# Patient Record
Sex: Female | Born: 1960 | Race: White | Hispanic: No | Smoking: Never smoker
Health system: Southern US, Community
[De-identification: ages and names within clinical notes are randomized; demographics above are authoritative.]

## PROBLEM LIST (undated history)

## (undated) DIAGNOSIS — D0510 Intraductal carcinoma in situ of unspecified breast: Secondary | ICD-10-CM

## (undated) DIAGNOSIS — Z803 Family history of malignant neoplasm of breast: Secondary | ICD-10-CM

## (undated) DIAGNOSIS — J189 Pneumonia, unspecified organism: Secondary | ICD-10-CM

## (undated) HISTORY — PX: BREAST BIOPSY: SHX20

## (undated) HISTORY — DX: Pneumonia, unspecified organism: J18.9

## (undated) HISTORY — DX: Intraductal carcinoma in situ of unspecified breast: D05.10

## (undated) HISTORY — DX: Family history of malignant neoplasm of breast: Z80.3

---

## 1995-03-15 HISTORY — PX: TUBAL LIGATION: SHX77

## 2013-09-11 DIAGNOSIS — I341 Nonrheumatic mitral (valve) prolapse: Secondary | ICD-10-CM | POA: Insufficient documentation

## 2014-07-18 DIAGNOSIS — R000 Tachycardia, unspecified: Secondary | ICD-10-CM | POA: Insufficient documentation

## 2014-12-12 ENCOUNTER — Other Ambulatory Visit: Payer: Self-pay | Admitting: Otolaryngology

## 2014-12-12 DIAGNOSIS — R220 Localized swelling, mass and lump, head: Secondary | ICD-10-CM

## 2014-12-12 DIAGNOSIS — R221 Localized swelling, mass and lump, neck: Principal | ICD-10-CM

## 2014-12-17 ENCOUNTER — Ambulatory Visit: Admission: RE | Admit: 2014-12-17 | Payer: Self-pay | Source: Ambulatory Visit

## 2014-12-19 ENCOUNTER — Ambulatory Visit
Admission: RE | Admit: 2014-12-19 | Discharge: 2014-12-19 | Disposition: A | Discharge status: Home / Self Care | Payer: BLUE CROSS/BLUE SHIELD | Source: Ambulatory Visit | Attending: Otolaryngology | Admitting: Otolaryngology

## 2014-12-19 DIAGNOSIS — R221 Localized swelling, mass and lump, neck: Secondary | ICD-10-CM | POA: Diagnosis not present

## 2014-12-19 DIAGNOSIS — R220 Localized swelling, mass and lump, head: Secondary | ICD-10-CM

## 2014-12-19 MED ORDER — IOHEXOL 350 MG/ML SOLN
75.0000 mL | Freq: Once | INTRAVENOUS | Status: AC | PRN
  Administered 2014-12-19: 75 mL via INTRAVENOUS

## 2014-12-25 DIAGNOSIS — Z8673 Personal history of transient ischemic attack (TIA), and cerebral infarction without residual deficits: Secondary | ICD-10-CM | POA: Insufficient documentation

## 2015-01-07 DIAGNOSIS — E782 Mixed hyperlipidemia: Secondary | ICD-10-CM | POA: Insufficient documentation

## 2016-10-06 IMAGING — CT CT NECK W/ CM
4 of 5 series · 16 of 33 positions shown, 18 images · IV contrast (omnipaque)
Comparison: None.

CLINICAL DATA: Swelling, mass, or lump in the neck. Intermittent
pressure in the right side of the neck with left-sided numbness.

EXAM:
CT NECK WITH CONTRAST
TECHNIQUE: Multidetector CT imaging of the neck was performed using the
standard protocol following the bolus administration of intravenous
contrast.
CONTRAST:  75mL OMNIPAQUE IOHEXOL 350 MG/ML SOLN

[Series 4: axial neck · axial · 0.52mm/px · z∈[-523,-385]mm · 4 of 116 slices shown, 5 images]
[im 24/116  soft-tissue]
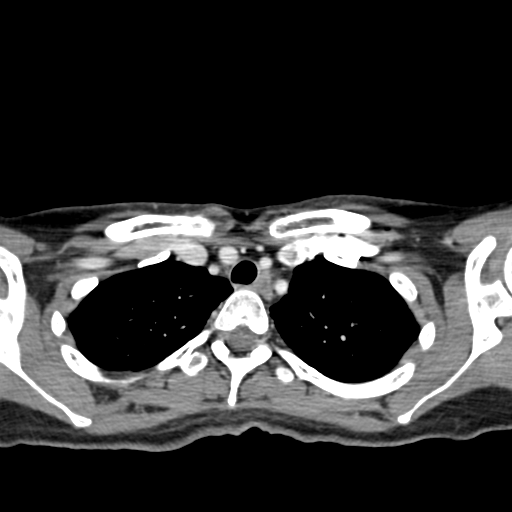
[im 24/116  bone]
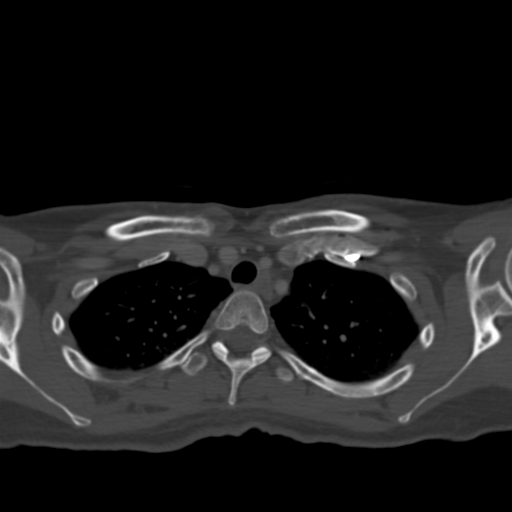
[im 47/116  bone]
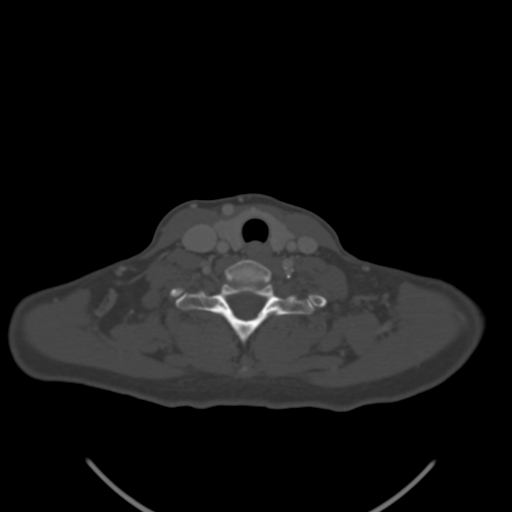
[im 70/116  bone]
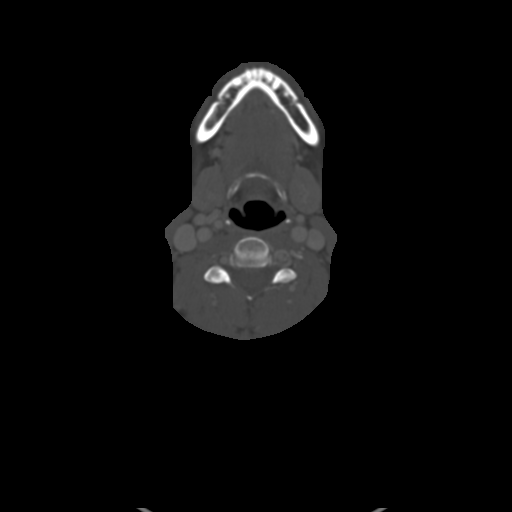
[im 93/116  bone]
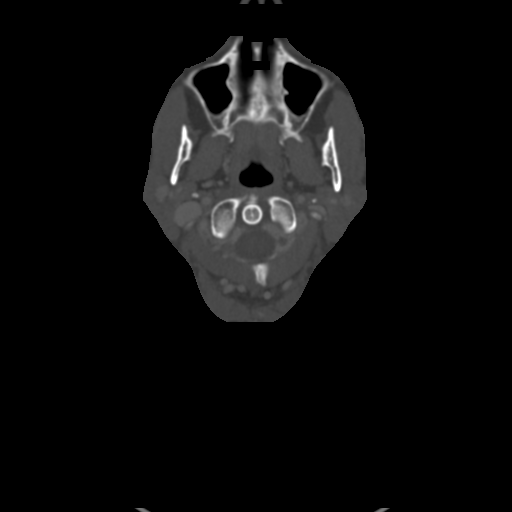

[Series 602: coronal · coronal · 0.52mm/px · 3 of 86 slices shown]
[im 18/86  bone]
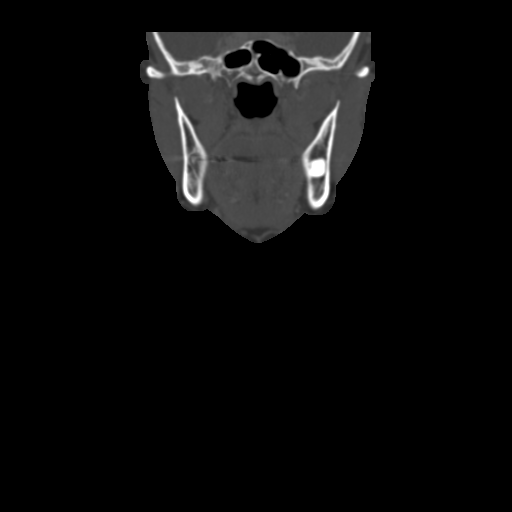
[im 35/86  bone]
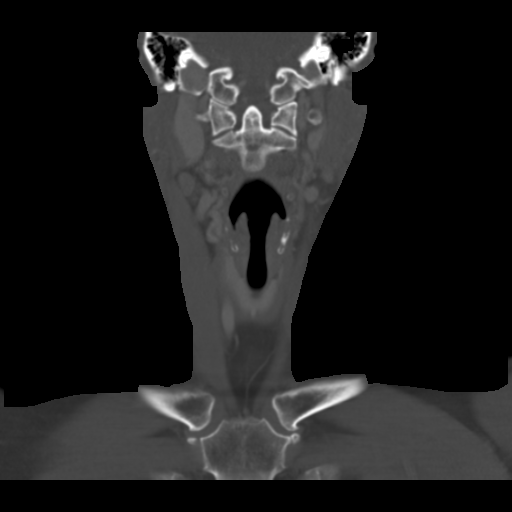
[im 52/86  bone]
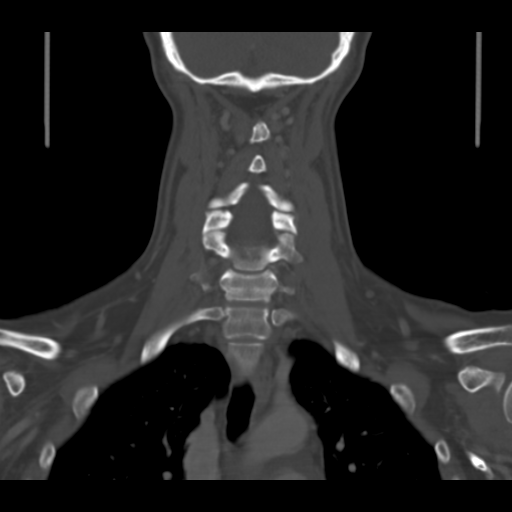

[Series 603: sagittal · sagittal · 0.52mm/px · 5 of 90 slices shown, 6 images]
[im 30/90  bone]
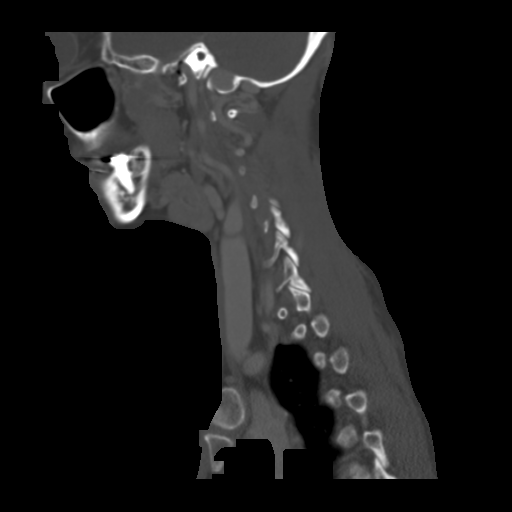
[im 38/90  bone]
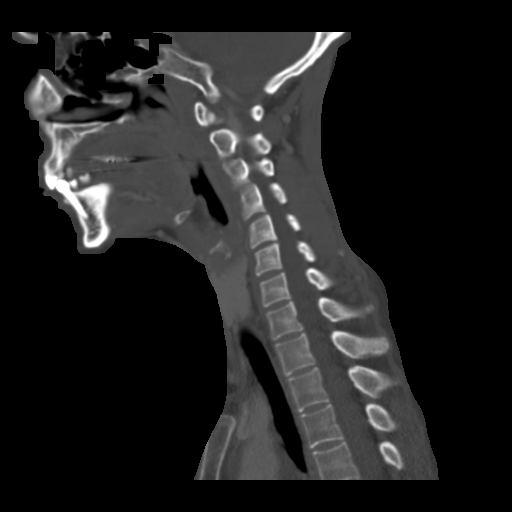
[im 45/90  soft-tissue]
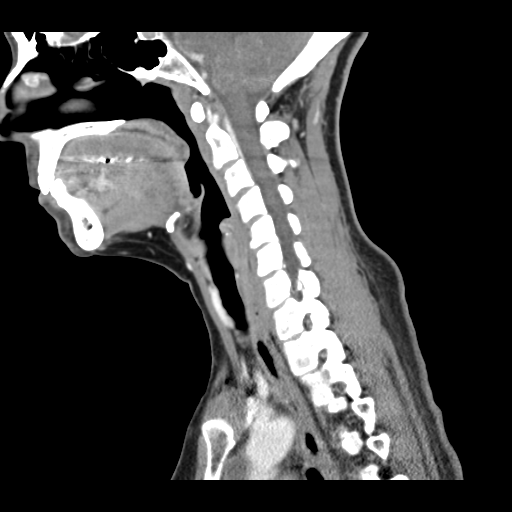
[im 45/90  bone]
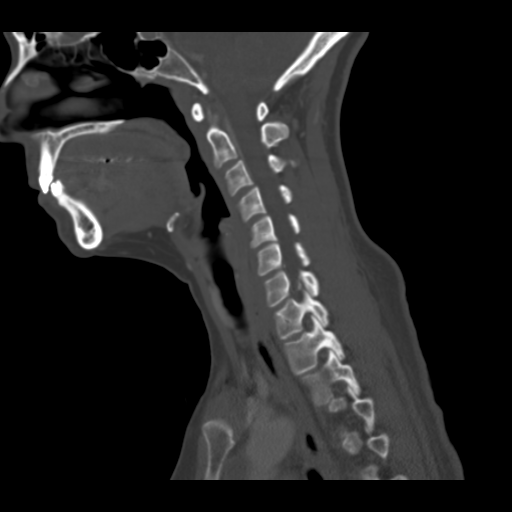
[im 52/90  bone]
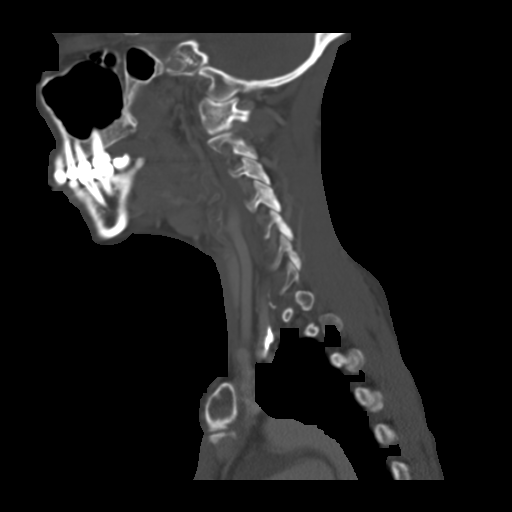
[im 60/90  bone]
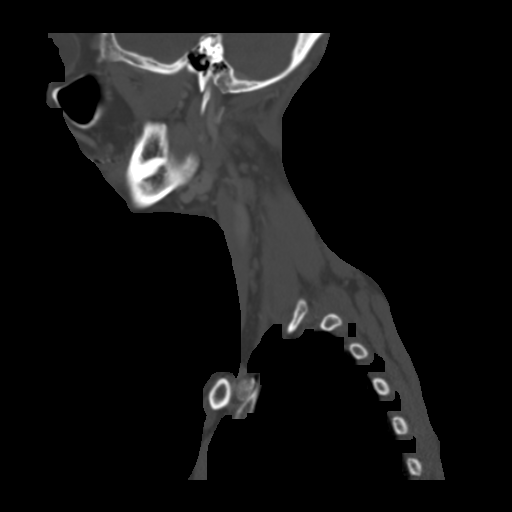

[Series 604: cranial caudal axial · axial · 0.52mm/px · z∈[-473,-337]mm · 4 of 120 slices shown]
[im 24/120  bone]
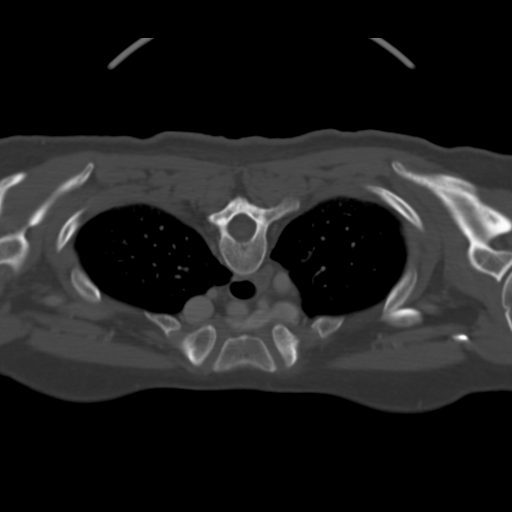
[im 48/120  bone]
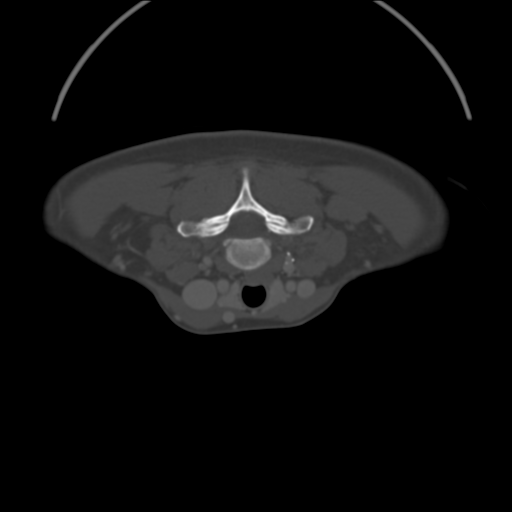
[im 72/120  bone]
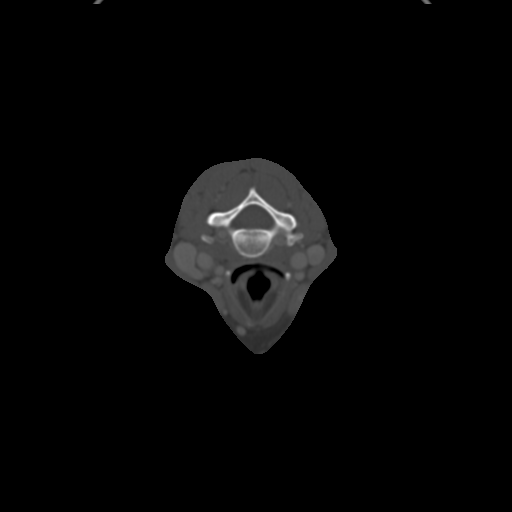
[im 96/120  bone]
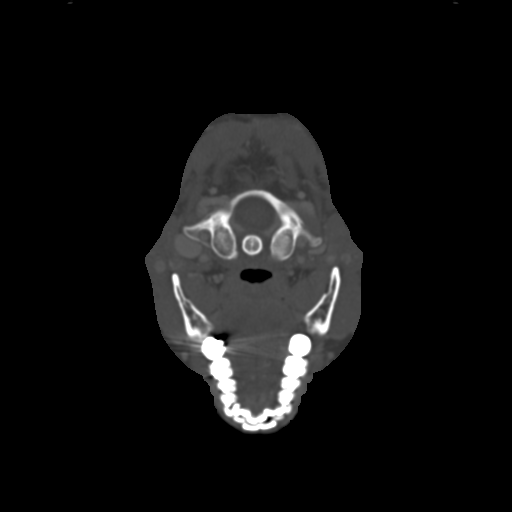

[16 of 33 positions shown; findings below may reference images not displayed]

FINDINGS: Pharynx and larynx: No focal mucosal or submucosal lesions are
present. The tongue base is within normal limits. Vocal cords are
midline and symmetric.

Salivary glands: Within normal limits

Thyroid: Negative

Lymph nodes: There is mild prominence of level 2 lymph nodes
bilaterally. The largest right-sided node measures 11 x 7 mm. The
largest left-sided nodes are posterior measuring 9 x 14 mm.

Vascular: No significant atherosclerotic calcifications or stenoses
are evident.

Limited intracranial: Within normal limits

Visualized orbits: Unremarkable

Mastoids and visualized paranasal sinuses: Cleared

Skeleton: Within normal limits

Upper chest: The lung apices are clear. There is some fluid post
anterior and inferior to the aortic arch which is likely within the
pericardial recess. Calcification in the left renal hilum likely
represents previous granulomatous disease.
IMPRESSION: 1. Fluid anterior and inferior to the aortic arch suggests a
pericardial effusion. The remainder the mediastinum is not imaged.
Echocardiography is recommended for further evaluation. CT the chest
with contrast could be used alternatively.
2. Reactive sized level 2 lymph nodes bilaterally.
These results were called by telephone at the time of interpretation
on 12/19/2014 at [DATE] to Dr. TWYLA MARS , who verbally
acknowledged these results.

## 2022-09-12 ENCOUNTER — Ambulatory Visit: Payer: Self-pay | Attending: Family

## 2022-09-12 ENCOUNTER — Ambulatory Visit (INDEPENDENT_AMBULATORY_CARE_PROVIDER_SITE_OTHER): Payer: Self-pay | Admitting: Family

## 2022-09-12 ENCOUNTER — Encounter: Payer: Self-pay | Admitting: Family

## 2022-09-12 VITALS — BP 128/80 | HR 77 | Temp 97.6°F | Ht 62.0 in | Wt 141.6 lb

## 2022-09-12 DIAGNOSIS — R002 Palpitations: Secondary | ICD-10-CM

## 2022-09-12 DIAGNOSIS — Z0000 Encounter for general adult medical examination without abnormal findings: Secondary | ICD-10-CM

## 2022-09-12 DIAGNOSIS — Z1159 Encounter for screening for other viral diseases: Secondary | ICD-10-CM

## 2022-09-12 NOTE — Assessment & Plan Note (Signed)
Reviewed past medical ,surgical and family history.

## 2022-09-12 NOTE — Assessment & Plan Note (Signed)
No chest pain or palpitations today.  Discussed episodic palpitation, shortness of breath.  EKG obtained with questionable Q wave lead aVF, nonspecific T wave inversion V1, aVF.  Advised patient to remain hypervigilant and if chest pain or palpitations were to recur to call 911 or present to the nearest emergency room.  I ordered echocardiogram, ZIO monitor and placed a referral to cardiology.  Pending baseline labs

## 2022-09-12 NOTE — Patient Instructions (Addendum)
Please call  Delford Field and discuss cost of  3D mammogram     [facility]  ( new location in 2023)  [address], [place], [place] [address]  [place], [place]  [phone]   Referral to cardiology, echocardiogram  Let us know if you dont hear back within a week in regards to an appointment being scheduled.   So that you are aware, if you are [facility] MyChart user , please pay attention to your MyChart messages as you may receive a MyChart message with a phone number to call and schedule this test/appointment own your own from our referral coordinator. This is a new process so I do not want you to miss this message.  If you are not a MyChart user, you will receive a phone call.    I have ordered a 14 day Zio monitor which will mailed directly to you. The device will include instructions on how to apply.   The Zio 14 day monitor that we use DOES NOT have 24 hour live monitoring. The rhythm of your heart will not be monitored while you are wearing it. Only AFTER you mail the device back in and a cardiologist interprets the data will we know if an underlying cardiac arrhythmia is going on.   There are models that offer 24 hour live monitoring however NOT this one. I wanted to be sure that you were aware of this as certainly didn't want this to be a false sense of security.   If you experience chest pain, shortness of breath, left arm numbness, or sustained, more frequent palpitations , do not wait and call 911 right away. We are in a delicate period of work up regarding palpitations and until we are sure that you do not have an underlying arrhythmia, you must be extremely vigilant and cautious.      ZIO XT- Long Term Monitor Instructions   Your provider has requested you wear a ZIO patch monitor for 14 days.  This is a single patch monitor. [employer] supplies one patch monitor per enrollment. Additional stickers are not available. Please do not apply patch if you will be  having a Nuclear Stress Test,  Echocardiogram, Cardiac CT, MRI, or Chest Xray during the period you would be wearing the  monitor. The patch cannot be worn during these tests. You cannot remove and re-apply the  ZIO XT patch monitor.  Your ZIO patch monitor will be mailed 3 day USPS to your address on file. It may take 3-5 days  to receive your monitor after you have been enrolled.  Once you have received your monitor, please review the enclosed instructions. Your monitor  has already been registered assigning a specific monitor serial # to you.   Billing and Patient Assistance Program Information   We have supplied [employer] with any of your insurance information on file for billing purposes. [employer] offers a sliding scale Patient Assistance Program for patients that do not have  insurance, or whose insurance does not completely cover the cost of the ZIO monitor.  You must apply for the Patient Assistance Program to qualify for this discounted rate.  To apply, please call [employer] at [phone], select option 4, select option 2, ask to apply for  Patient Assistance Program. Meredeth Ide will ask your household income, and how many people  are in your household. They will quote your out-of-pocket cost based on that information.  [employer] will also be able to set up a 83-month, interest-free payment  plan if needed.   Applying the monitor   Shave hair from upper left chest.  Hold abrader disc by orange tab. Rub abrader in 40 strokes over the upper left chest as  indicated in your monitor instructions.  Clean area with 4 enclosed alcohol pads. Let dry.  Apply patch as indicated in monitor instructions. Patch will be placed under collarbone on left  side of chest with arrow pointing upward.  Rub patch adhesive wings for 2 minutes. Remove white label marked "1". Remove the white  label marked "2". Rub patch adhesive wings for 2 additional minutes.  While looking in a mirror, press and release  button in center of patch. A small green light will  flash 3-4 times. This will be your only indicator that the monitor has been turned on.  Do not shower for the first 24 hours. You may shower after the first 24 hours.  Press the button if you feel a symptom. You will hear a small click. Record Date, Time and  Symptom in the Patient Logbook.  When you are ready to remove the patch, follow instructions on the last 2 pages of Patient  Logbook. Stick patch monitor onto the last page of Patient Logbook.  Place Patient Logbook in the blue and white box. Use locking tab on box and tape box closed  securely. The blue and white box has prepaid postage on it. Please place it in the mailbox as  soon as possible. Your physician should have your test results approximately 7 days after the  monitor has been mailed back to [facility].  Call [facility] Customer Care at [phone] if you have questions regarding  your ZIO XT patch monitor. Call them immediately if you see an orange light blinking on your  monitor.  If your monitor falls off in less than 4 days, contact our Monitor department at [phone].  If your monitor becomes loose or falls off after 4 days call [employer] at [phone] for  suggestions on securing your monitor       Palpitations Palpitations are feelings that your heartbeat is irregular or is faster than normal. It may feel like your heart is fluttering or skipping a beat. Palpitations may be caused by many things, including smoking, caffeine, alcohol, stress, and certain medicines or drugs. Most causes of palpitations are not serious.  However, some palpitations can be a sign of a serious problem. Further tests and a thorough medical history will be done to find the cause of your palpitations. Your provider may order tests such as an ECG, labs, an echocardiogram, or an ambulatory continuous ECG monitor. Follow these instructions at home: Pay attention to any changes  in your symptoms. Let your health care provider know about them. Take these actions to help manage your symptoms: Eating and drinking Follow instructions from your health care provider about eating or drinking restrictions. You may need to avoid foods and drinks that may cause palpitations. These may include: Caffeinated coffee, tea, soft drinks, and energy drinks. Chocolate. Alcohol. Diet pills. Lifestyle     Take steps to reduce your stress and anxiety. Things that can help you relax include: Yoga. Mind-body activities, such as deep breathing, meditation, or using words and images to create positive thoughts (guided imagery). Physical activity, such as swimming, jogging, or walking. Tell your health care provider if your palpitations increase with activity. If you have chest pain or shortness of breath with activity, do not continue the activity until you are seen by  your health care provider. Biofeedback. This is a method that helps you learn to use your mind to control things in your body, such as your heartbeat. Get plenty of rest and sleep. Keep a regular bed time. Do not use drugs, including cocaine or ecstasy. Do not use marijuana. Do not use any products that contain nicotine or tobacco. These products include cigarettes, chewing tobacco, and vaping devices, such as e-cigarettes. If you need help quitting, ask your health care provider. General instructions Take over-the-counter and prescription medicines only as told by your health care provider. Keep all follow-up visits. This is important. These may include visits for further testing if palpitations do not go away or get worse. Contact a health care provider if: You continue to have a fast or irregular heartbeat for a long period of time. You notice that your palpitations occur more often. Get help right away if: You have chest pain or shortness of breath. You have a severe headache. You feel dizzy or you faint. These  symptoms may represent a serious problem that is an emergency. Do not wait to see if the symptoms will go away. Get medical help right away. Call your local emergency services (911 in the [place] Do not drive yourself to the hospital. Summary Palpitations are feelings that your heartbeat is irregular or is faster than normal. It may feel like your heart is fluttering or skipping a beat. Palpitations may be caused by many things, including smoking, caffeine, alcohol, stress, certain medicines, and drugs. Further tests and a thorough medical history may be done to find the cause of your palpitations. Get help right away if you faint or have chest pain, shortness of breath, severe headache, or dizziness. This information is not intended to replace advice given to you by your health care provider. Make sure you discuss any questions you have with your health care provider. Document Revised: 07/22/2020 Document Reviewed: 07/22/2020 Elsevier Patient Education  2024 [employer].

## 2022-09-12 NOTE — Progress Notes (Unsigned)
Assessment & Plan:  Encounter for medical examination to establish care Assessment & Plan: Reviewed past medical ,surgical and family history.  Orders: -     CBC with Differential/Platelet; Future -     Comprehensive metabolic panel; Future -     Hemoglobin A1c; Future -     Lipid panel; Future -     Microalbumin / creatinine urine ratio; Future -     VITAMIN D 25 Hydroxy (Vit-D Deficiency, Fractures); Future -     Urinalysis, Routine w reflex microscopic; Future  Encounter for hepatitis C screening test for low risk patient -     Hepatitis C antibody; Future  Heart palpitations -     EKG 12-Lead  Palpitation Assessment & Plan: No chest pain or palpitations today.  Discussed episodic palpitation, shortness of breath.  EKG obtained with questionable Q wave lead aVF, nonspecific T wave inversion V1, aVF.  Advised patient to remain hypervigilant and if chest pain or palpitations were to recur to call 911 or present to the nearest emergency room.  I ordered echocardiogram, ZIO monitor and placed a referral to cardiology.  Pending baseline labs  Orders: -     ECHOCARDIOGRAM COMPLETE; Future -     LONG TERM MONITOR (3-14 DAYS); Future -     Ambulatory referral to Cardiology     Return precautions given.   Risks, benefits, and alternatives of the medications and treatment plan prescribed today were discussed, and patient expressed understanding.   Education regarding symptom management and diagnosis given to patient on AVS either electronically or printed.  Return in about 6 weeks (around 10/24/2022) for Fasting labs in 2-3 weeks.  Traci Plowman, FNP  Subjective:    Patient ID: Traci Knight, female    DOB: April 06, 1960, 62 y.o.   MRN: [id number]  CC: Traci Knight is a 62 y.o. female who presents today to establish care.    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 He passed last  year, and stress has improved.   Less episodes of palpitations.  There have been times where she felt pressure over her chest, and she took an 81 mg aspirin with relief. No CP today.   No chest pain with exercise.    She complains of 'high heart rate' such as when mows the grass, HR will approach 170 on her watch.  She had episodes of feeling short of breath when mowing the grass ; she rested for a few minutes and was able to resume mowing the grass.    She walks the dogs daily.   No associated crushing chest pain, dizziness, HA, vision changes  She hasn't seen PCP in a couple of years.   Echocardiogram 12/2013 Normal left systolic function , mild MVR  Stress test 12/2014 - she states incomplete and she didn't finish ( no results in epic under [facility])   Allergies: Sulfa antibiotics No current outpatient medications on file prior to visit.   No current facility-administered medications on file prior to visit.    Review of Systems  Constitutional:  Negative for chills and fever.  Respiratory:  Negative for cough.   Cardiovascular:  Positive for chest pain and palpitations.  Gastrointestinal:  Negative for nausea and vomiting.  Neurological:  Negative for dizziness and headaches.      Objective:    BP 128/80   Pulse 77   Temp 97.6 F (36.4 C) (Oral)  Ht 5\' 2"  (1.575 m)   Wt 141 lb 9.6 oz (64.2 kg)   LMP  (LMP Unknown)   SpO2 98%   BMI 25.90 kg/m  BP Readings from Last 3 Encounters:  09/12/22 128/80   Wt Readings from Last 3 Encounters:  09/12/22 141 lb 9.6 oz (64.2 kg)    Physical Exam Vitals reviewed.  Constitutional:      Appearance: She is well-developed.  Eyes:     Conjunctiva/sclera: Conjunctivae normal.  Neck:     Vascular: No carotid bruit.  Cardiovascular:     Rate and Rhythm: Normal rate and regular rhythm.     Pulses: Normal pulses.     Heart sounds: Normal heart sounds.  Pulmonary:     Effort: Pulmonary effort is normal.     Breath sounds:  Normal breath sounds. No wheezing, rhonchi or rales.  Skin:    General: Skin is warm and dry.  Neurological:     Mental Status: She is alert.  Psychiatric:        Speech: Speech normal.        Behavior: Behavior normal.        Thought Content: Thought content normal.

## 2022-09-14 DIAGNOSIS — R002 Palpitations: Secondary | ICD-10-CM

## 2022-09-26 ENCOUNTER — Other Ambulatory Visit (INDEPENDENT_AMBULATORY_CARE_PROVIDER_SITE_OTHER): Payer: Self-pay

## 2022-09-26 DIAGNOSIS — R7989 Other specified abnormal findings of blood chemistry: Secondary | ICD-10-CM

## 2022-09-26 DIAGNOSIS — Z0000 Encounter for general adult medical examination without abnormal findings: Secondary | ICD-10-CM

## 2022-09-26 DIAGNOSIS — Z1159 Encounter for screening for other viral diseases: Secondary | ICD-10-CM

## 2022-09-26 DIAGNOSIS — R002 Palpitations: Secondary | ICD-10-CM

## 2022-09-26 LAB — URINALYSIS, ROUTINE W REFLEX MICROSCOPIC
Bilirubin Urine: NEGATIVE
Hgb urine dipstick: NEGATIVE
Ketones, ur: NEGATIVE
Nitrite: NEGATIVE
Specific Gravity, Urine: 1.025 (ref 1.000–1.030)
Total Protein, Urine: NEGATIVE
Urine Glucose: NEGATIVE
Urobilinogen, UA: 0.2 (ref 0.0–1.0)
pH: 6 (ref 5.0–8.0)

## 2022-09-26 LAB — MICROALBUMIN / CREATININE URINE RATIO
Creatinine,U: 100.6 mg/dL
Microalb Creat Ratio: 0.7 mg/g (ref 0.0–30.0)
Microalb, Ur: <0.7 mg/dL (ref 0.0–1.9)

## 2022-09-26 LAB — VITAMIN D 25 HYDROXY (VIT D DEFICIENCY, FRACTURES): VITD: 32.18 ng/mL (ref 30.00–100.00)

## 2022-09-26 LAB — COMPREHENSIVE METABOLIC PANEL
ALT: 13 U/L (ref 0–35)
AST: 18 U/L (ref 0–37)
Albumin: 4.1 g/dL (ref 3.5–5.2)
Alkaline Phosphatase: 40 U/L (ref 39–117)
BUN: 19 mg/dL (ref 6–23)
CO2: 26 mEq/L (ref 19–32)
Calcium: 9.6 mg/dL (ref 8.4–10.5)
Chloride: 108 mEq/L (ref 96–112)
Creatinine, Ser: 0.93 mg/dL (ref 0.40–1.20)
GFR: 67.1 mL/min (ref >60.00)
Glucose, Bld: 96 mg/dL (ref 70–99)
Potassium: 4.6 mEq/L (ref 3.5–5.1)
Sodium: 140 mEq/L (ref 135–145)
Total Bilirubin: 0.5 mg/dL (ref 0.2–1.2)
Total Protein: 6.5 g/dL (ref 6.0–8.3)

## 2022-09-26 LAB — CBC WITH DIFFERENTIAL/PLATELET
Basophils Absolute: 0 10*3/uL (ref 0.0–0.1)
Basophils Relative: 0.6 % (ref 0.0–3.0)
Eosinophils Absolute: 0.3 10*3/uL (ref 0.0–0.7)
Eosinophils Relative: 6.6 % — ABNORMAL HIGH (ref 0.0–5.0)
HCT: 41.6 % (ref 36.0–46.0)
Hemoglobin: 13.4 g/dL (ref 12.0–15.0)
Lymphocytes Relative: 37.8 % (ref 12.0–46.0)
Lymphs Abs: 1.5 10*3/uL (ref 0.7–4.0)
MCHC: 32.1 g/dL (ref 30.0–36.0)
MCV: 87.8 fl (ref 78.0–100.0)
Monocytes Absolute: 0.3 10*3/uL (ref 0.1–1.0)
Monocytes Relative: 8.5 % (ref 3.0–12.0)
Neutro Abs: 1.8 10*3/uL (ref 1.4–7.7)
Neutrophils Relative %: 46.5 % (ref 43.0–77.0)
Platelets: 285 10*3/uL (ref 150.0–400.0)
RBC: 4.74 Mil/uL (ref 3.87–5.11)
RDW: 14.4 % (ref 11.5–15.5)
WBC: 4 10*3/uL (ref 4.0–10.5)

## 2022-09-26 LAB — LIPID PANEL
Cholesterol: 242 mg/dL — ABNORMAL HIGH (ref 0–200)
HDL: 55.1 mg/dL (ref >39.00)
LDL Cholesterol: 167 mg/dL — ABNORMAL HIGH (ref 0–99)
NonHDL: 187.39
Total CHOL/HDL Ratio: 4
Triglycerides: 101 mg/dL (ref 0.0–149.0)
VLDL: 20.2 mg/dL (ref 0.0–40.0)

## 2022-09-26 LAB — HEMOGLOBIN A1C: Hgb A1c MFr Bld: 5.6 % (ref 4.6–6.5)

## 2022-09-27 ENCOUNTER — Other Ambulatory Visit: Payer: Self-pay

## 2022-09-27 LAB — HEPATITIS C ANTIBODY: Hepatitis C Ab: NONREACTIVE

## 2022-10-03 ENCOUNTER — Encounter: Payer: Self-pay | Admitting: Family

## 2022-10-04 NOTE — Telephone Encounter (Signed)
Spoke to pt and offered her an appt but pt declined. Pt stated that she will go elsewhere.

## 2022-10-06 NOTE — Addendum Note (Signed)
Addended by: [place], Caid Radin on: 10/06/2022 01:33 PM   Modules accepted: Orders

## 2022-10-06 NOTE — Addendum Note (Signed)
Addended by: [place], Yarden Hillis on: 10/06/2022 01:43 PM   Modules accepted: Orders

## 2022-10-12 ENCOUNTER — Encounter (INDEPENDENT_AMBULATORY_CARE_PROVIDER_SITE_OTHER): Payer: Self-pay

## 2022-10-17 ENCOUNTER — Ambulatory Visit
Admission: RE | Admit: 2022-10-17 | Discharge: 2022-10-17 | Disposition: A | Discharge status: Home / Self Care | Payer: Self-pay | Source: Ambulatory Visit | Attending: Family | Admitting: Family

## 2022-10-17 DIAGNOSIS — I083 Combined rheumatic disorders of mitral, aortic and tricuspid valves: Secondary | ICD-10-CM | POA: Insufficient documentation

## 2022-10-17 DIAGNOSIS — R002 Palpitations: Secondary | ICD-10-CM

## 2022-10-17 DIAGNOSIS — R9431 Abnormal electrocardiogram [ECG] [EKG]: Secondary | ICD-10-CM | POA: Insufficient documentation

## 2022-10-17 LAB — ECHOCARDIOGRAM COMPLETE
AR max vel: 2.41 cm2
AV Area VTI: 2.29 cm2
AV Area mean vel: 2.22 cm2
AV Mean grad: 2 mmHg
AV Peak grad: 4.5 mmHg
Ao pk vel: 1.06 m/s
Area-P 1/2: 3.58 cm2
MV VTI: 2.66 cm2
S' Lateral: 3 cm

## 2022-10-17 NOTE — Progress Notes (Signed)
*  PRELIMINARY RESULTS* Echocardiogram 2D Echocardiogram has been performed.  Traci Knight 10/17/2022, 9:46 AM

## 2022-10-26 ENCOUNTER — Encounter: Payer: Self-pay | Admitting: Family

## 2022-10-26 ENCOUNTER — Ambulatory Visit (INDEPENDENT_AMBULATORY_CARE_PROVIDER_SITE_OTHER): Payer: Self-pay | Admitting: Family

## 2022-10-26 VITALS — BP 126/78 | HR 91 | Temp 97.9°F | Ht 62.0 in | Wt 143.0 lb

## 2022-10-26 DIAGNOSIS — N390 Urinary tract infection, site not specified: Secondary | ICD-10-CM

## 2022-10-26 DIAGNOSIS — R7989 Other specified abnormal findings of blood chemistry: Secondary | ICD-10-CM

## 2022-10-26 DIAGNOSIS — Z1231 Encounter for screening mammogram for malignant neoplasm of breast: Secondary | ICD-10-CM

## 2022-10-26 DIAGNOSIS — R002 Palpitations: Secondary | ICD-10-CM

## 2022-10-26 NOTE — Progress Notes (Signed)
Assessment & Plan:  Encounter for screening mammogram for malignant neoplasm of breast -     3D Screening Mammogram, Left and Right; Future  Palpitation Assessment & Plan: No palpitations.  No further episodes of chest tightness.  Reviewed ZIO monitor, echocardiogram which was overall reassuring.   She has an appointment scheduled with Dr Sandie Ano.  Question if patient needs ischemic evaluation.  Orders: -     CBC with Differential/Platelet  Abnormal CBC -     CBC with Differential/Platelet  Recurrent UTI Assessment & Plan: Fortunately no symptoms at this time.  Counseled on preventative measures including cranberry supplement, d-mannose.  Patient will try these supplements and will continue to monitor for symptoms.  Reiterated the importance of testing urine with urinalysis and urine culture for safest treatment, prevention of antibiotic resistance.       Return precautions given.   Risks, benefits, and alternatives of the medications and treatment plan prescribed today were discussed, and patient expressed understanding.   Education regarding symptom management and diagnosis given to patient on AVS either electronically or printed.  Return in about 1 month (around 11/26/2022) for Complete Physical Exam.  [name], FNP  Subjective:    Patient ID: Traci Knight, female    DOB: 1960/11/10, 62 y.o.   MRN: [id number]  CC: Traci Knight is a 62 y.o. female who presents today for follow up.   HPI: Follow up palpitations, cp  She has not recent episode of 'chest tightness'. She states it is not 'chest pain' and further describes that she doesn't have palpitations.   She mows the yard and HR is 178.  She has been less active for the past 8 months.   No dizziness, wheezing, cough, epigastric burning, palpitations.   She reports that she has had a stress test in the past, results were unclear per patient.        ZIO monitor worn for 4 days, 3 triggers and then a  second time for 14 days. No sustained arrhythmias.  Patient has follow-up scheduled with Dr Etang,11/28/22  Echocardiogram 10/17/22 without significant valvular disease.  Ejection fraction normal  She describes a history of recurrent UTIs.  Denies fever, dysuria, urinary frequency at this time.  She stated she used to get urinary tract infections after intercourse.   Allergies: Sulfa antibiotics No current outpatient medications on file prior to visit.   No current facility-administered medications on file prior to visit.    Review of Systems  Constitutional:  Negative for chills and fever.  Respiratory:  Negative for cough and shortness of breath.   Cardiovascular:  Negative for chest pain and palpitations.  Gastrointestinal:  Negative for nausea and vomiting.      Objective:    BP 126/78   Pulse 91   Temp 97.9 F (36.6 C) (Oral)   Ht 5\' 2"  (1.575 m)   Wt 143 lb (64.9 kg)   SpO2 98%   BMI 26.16 kg/m  BP Readings from Last 3 Encounters:  10/26/22 126/78  09/12/22 128/80   Wt Readings from Last 3 Encounters:  10/26/22 143 lb (64.9 kg)  09/12/22 141 lb 9.6 oz (64.2 kg)    Physical Exam Vitals reviewed.  Constitutional:      Appearance: She is well-developed.  Eyes:     Conjunctiva/sclera: Conjunctivae normal.  Cardiovascular:     Rate and Rhythm: Normal rate and regular rhythm.     Pulses: Normal pulses.     Heart sounds: Normal heart sounds.  Pulmonary:  Effort: Pulmonary effort is normal.     Breath sounds: Normal breath sounds. No wheezing, rhonchi or rales.  Skin:    General: Skin is warm and dry.  Neurological:     Mental Status: She is alert.  Psychiatric:        Speech: Speech normal.        Behavior: Behavior normal.        Thought Content: Thought content normal.

## 2022-10-26 NOTE — Patient Instructions (Addendum)
Please bring colonoscopy report to the office so that I can scan in to the chart.  We talked about urinary tract prevention, you may try over-the-counter Azo which includes d-mannose.  You also may take over-the-counter cranberry supplement.  There is good data to support both of these help with prevention.  Please call  and schedule your 3D mammogram and /or bone density scan as we discussed.   [facility]  ( new location in 2023)  [address], [place], [place] [address]  [place], [place]  [phone]

## 2022-10-27 ENCOUNTER — Encounter (INDEPENDENT_AMBULATORY_CARE_PROVIDER_SITE_OTHER): Payer: Self-pay

## 2022-10-27 LAB — CBC WITH DIFFERENTIAL/PLATELET
Basophils Absolute: 0 10*3/uL (ref 0.0–0.1)
Basophils Relative: 1.1 % (ref 0.0–3.0)
Eosinophils Absolute: 0.1 10*3/uL (ref 0.0–0.7)
Eosinophils Relative: 2.2 % (ref 0.0–5.0)
HCT: 38.9 % (ref 36.0–46.0)
Hemoglobin: 12.6 g/dL (ref 12.0–15.0)
Lymphocytes Relative: 40.2 % (ref 12.0–46.0)
Lymphs Abs: 1.7 10*3/uL (ref 0.7–4.0)
MCHC: 32.3 g/dL (ref 30.0–36.0)
MCV: 87.6 fl (ref 78.0–100.0)
Monocytes Absolute: 0.4 10*3/uL (ref 0.1–1.0)
Monocytes Relative: 9.3 % (ref 3.0–12.0)
Neutro Abs: 2 10*3/uL (ref 1.4–7.7)
Neutrophils Relative %: 47.2 % (ref 43.0–77.0)
Platelets: 265 10*3/uL (ref 150.0–400.0)
RBC: 4.44 Mil/uL (ref 3.87–5.11)
RDW: 13.4 % (ref 11.5–15.5)
WBC: 4.3 10*3/uL (ref 4.0–10.5)

## 2022-10-28 DIAGNOSIS — N390 Urinary tract infection, site not specified: Secondary | ICD-10-CM | POA: Insufficient documentation

## 2022-10-28 NOTE — Assessment & Plan Note (Signed)
Fortunately no symptoms at this time.  Counseled on preventative measures including cranberry supplement, d-mannose.  Patient will try these supplements and will continue to monitor for symptoms.  Reiterated the importance of testing urine with urinalysis and urine culture for safest treatment, prevention of antibiotic resistance.

## 2022-10-28 NOTE — Assessment & Plan Note (Signed)
No palpitations.  No further episodes of chest tightness.  Reviewed ZIO monitor, echocardiogram which was overall reassuring.   She has an appointment scheduled with Dr Sandie Ano.  Question if patient needs ischemic evaluation.

## 2022-11-09 ENCOUNTER — Other Ambulatory Visit: Payer: Self-pay | Admitting: Hematology and Oncology

## 2022-11-09 ENCOUNTER — Other Ambulatory Visit: Payer: Self-pay | Admitting: Oncology

## 2022-11-09 DIAGNOSIS — Z006 Encounter for examination for normal comparison and control in clinical research program: Secondary | ICD-10-CM

## 2022-11-09 DIAGNOSIS — Z803 Family history of malignant neoplasm of breast: Secondary | ICD-10-CM

## 2022-11-10 ENCOUNTER — Inpatient Hospital Stay
Admission: RE | Admit: 2022-11-10 | Discharge: 2022-11-10 | Disposition: A | Discharge status: Home / Self Care | Payer: Self-pay | Source: Ambulatory Visit | Attending: Family | Admitting: Family

## 2022-11-10 ENCOUNTER — Other Ambulatory Visit: Payer: Self-pay | Admitting: *Deleted

## 2022-11-10 DIAGNOSIS — Z1231 Encounter for screening mammogram for malignant neoplasm of breast: Secondary | ICD-10-CM

## 2022-11-11 ENCOUNTER — Telehealth: Payer: Self-pay | Admitting: Genetic Counselor

## 2022-11-23 ENCOUNTER — Encounter: Payer: Self-pay | Admitting: Family

## 2022-11-23 ENCOUNTER — Ambulatory Visit (INDEPENDENT_AMBULATORY_CARE_PROVIDER_SITE_OTHER): Payer: Self-pay | Admitting: Family

## 2022-11-23 VITALS — BP 124/78 | HR 72 | Temp 97.8°F | Ht 62.0 in | Wt 143.0 lb

## 2022-11-23 DIAGNOSIS — N390 Urinary tract infection, site not specified: Secondary | ICD-10-CM

## 2022-11-23 LAB — URINALYSIS, ROUTINE W REFLEX MICROSCOPIC
Bilirubin Urine: NEGATIVE
Hgb urine dipstick: NEGATIVE
Ketones, ur: NEGATIVE
Leukocytes,Ua: NEGATIVE
Nitrite: POSITIVE — AB
RBC / HPF: NONE SEEN (ref 0–?)
Specific Gravity, Urine: 1.01 (ref 1.000–1.030)
Total Protein, Urine: NEGATIVE
Urine Glucose: NEGATIVE
Urobilinogen, UA: 0.2 (ref 0.0–1.0)
pH: 6.5 (ref 5.0–8.0)

## 2022-11-23 NOTE — Progress Notes (Signed)
Assessment & Plan:  Recurrent UTI Assessment & Plan: Reassuring pelvic exam today .no documented urinary tract infection this calendar year.  Discussed with patient that vaginitis, vaginal atrophy in the setting of menopause could contribute as well.  In regards to incomplete bladder emptying, placed a referral to urology for further evaluation. Pending UA and urine culture  Orders: -     Ambulatory referral to Urology -     Urinalysis, Routine w reflex microscopic -     Urine Culture     Return precautions given.   Risks, benefits, and alternatives of the medications and treatment plan prescribed today were discussed, and patient expressed understanding.   Education regarding symptom management and diagnosis given to patient on AVS either electronically or printed.  Return in about 6 months (around 05/23/2023).  [name], FNP  Subjective:    Patient ID: Traci Knight, female    DOB: [birth year], 62 y.o.   MRN: [id number]  CC: Traci Knight is a 62 y.o. female who presents today for follow up  HPI: Here today for pelvic exam in the setting of incomplete bladder emptying.  She feels after she urinates, she still has to go.  She will urinate several times.   This has been chronic for years.  She does report frequent urinary tract infections.  Yesterday she felt pain with urination.  She drink plenty of water and she took a leftover "fluid pill".    The symptom has since resolved.  Denies fever, abdominal pain, hematuria, nausea     Pap smear and mammogram are scheduled per patient.  She declines Pap smear today.   Health Maintenance  Topic Date Due   DTaP/Tdap/Td vaccine (1 - Tdap) Never done   Pap Smear  Never done   Mammogram  Never done   Zoster (Shingles) Vaccine (1 of 2) Never done   COVID-19 Vaccine (4 - 2023-24 season) 11/13/2022   Flu Shot  06/12/2023*   HIV Screening  09/12/2023*   Colon Cancer Screening  11/23/2023*   Hepatitis C Screening   Completed   HPV Vaccine  Aged Out  *Topic was postponed. The date shown is not the original due date.    ALLERGIES: Sulfa antibiotics  No current outpatient medications on file prior to visit.   No current facility-administered medications on file prior to visit.    Review of Systems  Constitutional:  Negative for chills and fever.  Respiratory:  Negative for cough.   Cardiovascular:  Negative for chest pain and palpitations.  Gastrointestinal:  Negative for nausea and vomiting.  Genitourinary:  Positive for difficulty urinating and dysuria. Negative for frequency.      Objective:    BP 124/78   Pulse 72   Temp 97.8 F (36.6 C) (Oral)   Ht 5\' 2"  (1.575 m)   Wt 143 lb (64.9 kg)   LMP  (LMP Unknown)   SpO2 99%   BMI 26.16 kg/m   BP Readings from Last 3 Encounters:  11/23/22 124/78  10/26/22 126/78  09/12/22 128/80   Wt Readings from Last 3 Encounters:  11/23/22 143 lb (64.9 kg)  10/26/22 143 lb (64.9 kg)  09/12/22 141 lb 9.6 oz (64.2 kg)    Physical Exam Vitals reviewed.  Constitutional:      Appearance: She is well-developed.  Eyes:     Conjunctiva/sclera: Conjunctivae normal.  Cardiovascular:     Rate and Rhythm: Normal rate and regular rhythm.     Pulses: Normal pulses.  Heart sounds: Normal heart sounds.  Pulmonary:     Effort: Pulmonary effort is normal.     Breath sounds: Normal breath sounds. No wheezing, rhonchi or rales.  Genitourinary:    Labia:        Right: No rash, tenderness or lesion.        Left: No rash, tenderness or lesion.      Vagina: No foreign body. No vaginal discharge, erythema, tenderness or bleeding.     Cervix: No cervical motion tenderness or discharge.     Adnexa:        Right: No mass, tenderness or fullness.         Left: No mass, tenderness or fullness.       Comments: No vulvovaginal erythema. No lesions. Discharge is thin and clear, not purulent.  No prolapse from the vagina.  Skin:    General: Skin is warm and  dry.  Neurological:     Mental Status: She is alert.  Psychiatric:        Speech: Speech normal.        Behavior: Behavior normal.        Thought Content: Thought content normal.

## 2022-11-23 NOTE — Assessment & Plan Note (Addendum)
Reassuring pelvic exam today .no documented urinary tract infection this calendar year.  Discussed with patient that vaginitis, vaginal atrophy in the setting of menopause could contribute as well.  In regards to incomplete bladder emptying, placed a referral to urology for further evaluation. Pending UA and urine culture

## 2022-11-23 NOTE — Patient Instructions (Addendum)
Referral to urology  Let us know if you dont hear back within a week in regards to an appointment being scheduled.   So that you are aware, if you are [facility] MyChart user , please pay attention to your MyChart messages as you may receive a MyChart message with a phone number to call and schedule this test/appointment own your own from our referral coordinator. This is a new process so I do not want you to miss this message.  If you are not a MyChart user, you will receive a phone call.     Please have pap smear and mammogram as discussed

## 2022-11-24 ENCOUNTER — Inpatient Hospital Stay: Payer: Self-pay | Attending: Genetic Counselor | Admitting: Genetic Counselor

## 2022-11-24 ENCOUNTER — Encounter: Payer: Self-pay | Admitting: Genetic Counselor

## 2022-11-24 ENCOUNTER — Inpatient Hospital Stay: Payer: Self-pay

## 2022-11-24 ENCOUNTER — Other Ambulatory Visit: Payer: Self-pay | Admitting: Genetic Counselor

## 2022-11-24 DIAGNOSIS — Z803 Family history of malignant neoplasm of breast: Secondary | ICD-10-CM

## 2022-11-24 DIAGNOSIS — D0510 Intraductal carcinoma in situ of unspecified breast: Secondary | ICD-10-CM

## 2022-11-24 LAB — GENETIC SCREENING ORDER

## 2022-11-24 NOTE — Progress Notes (Signed)
REFERRING PROVIDER: Pascal Lux, NP [address] [place],  [place] [address]  PRIMARY PROVIDER:  Allegra Grana, FNP  PRIMARY REASON FOR VISIT:  1. Family history of breast cancer   2. Ductal carcinoma in situ (DCIS) of breast, unspecified laterality      HISTORY OF PRESENT ILLNESS:   Traci Knight, a 62 y.o. female, was seen for a [facility] cancer genetics consultation at the request of Dr. Doyne Keel due to a personal and family history of breast cancer.  Ms. Bobadilla presents to clinic today to discuss the possibility of a hereditary predisposition to cancer, genetic testing, and to further clarify her future cancer risks, as well as potential cancer risks for family members.   About 20 years ago, when she was around 68, Ms. Ko was diagnosed with DCIS.  The treatment plan included lumpectomy.   She was placed on tamoxifen but only took it for a few months.     CANCER HISTORY:  Oncology History   No history exists.     RISK FACTORS:  Menarche was at age 68-16.  First live birth at age 80.  OCP use for approximately 0 years.  Ovaries intact: yes.  Hysterectomy: no.  Menopausal status: postmenopausal.  HRT use: 0 years. Colonoscopy: yes; normal. Mammogram within the last year: yes. Number of breast biopsies: 1. Up to date with pelvic exams: yes. Any excessive radiation exposure in the past: no  Past Medical History:  Diagnosis Date   DCIS (ductal carcinoma in situ)    Family history of breast cancer    Pneumonia     Past Surgical History:  Procedure Laterality Date   BREAST BIOPSY Left    62 years of age; abnormal cells, prescribed tamoxifen she didnt take.    Social History   Socioeconomic History   Marital status: Unknown    Spouse name: Not on file   Number of children: Not on file   Years of education: Not on file   Highest education level: Not on file  Occupational History   Not on file  Tobacco Use   Smoking status: Never   Smokeless tobacco:  Never  Substance and Sexual Activity   Alcohol use: Yes   Drug use: Never   Sexual activity: Not on file  Other Topics Concern   Not on file  Social History Narrative   Husband passed away 12-01-2021   4 children      Sir Speedy    Social Determinants of Corporate investment banker Strain: Not on file  Food Insecurity: Not on file  Transportation Needs: Not on file  Physical Activity: Not on file  Stress: Not on file  Social Connections: Not on file     FAMILY HISTORY:  We obtained a detailed, 4-generation family history.  Significant diagnoses are listed below: Family History  Problem Relation Age of Onset   Breast cancer Mother 5   Breast cancer Maternal Aunt    Cancer Maternal Grandmother        Vulva dx. 60s   Throat cancer Maternal Grandfather    Diabetes Paternal Grandmother    Kidney failure Paternal Grandfather    Colon cancer Neg Hx      The patient has two daughters and two sons who are cancer free.  She has two sisters and two brothers who are cancer free.  Her father is living and her mother is deceased.  The patient's mother was diagnosed with breast cancer at 80 and  died at 48.  She had one sister and three brothers.  Her sister had breast cancer in her 26's and had a double mastectomy.  The maternal grandfather had throat cancer and the grandmother had vulvar cancer in her 34's.  The grandmother had about 12 siblings, some of whom had cancer.  The patient's father is living.  He had two sisters and two brothers, none reported to have cancer.  There is no other reported family history of cancer.  Ms. Ditton is unaware of previous family history of genetic testing for hereditary cancer risks. Patient's maternal ancestors are of [place] and [place] descent, and paternal ancestors are of [place] and [place] descent. There is no reported Ashkenazi Jewish ancestry. There is no known consanguinity.  GENETIC COUNSELING ASSESSMENT: Ms. Mcelderry is a 62 y.o. female with a  personal and family history of breast cancer which is somewhat suggestive of a hereditary cancer syndrome and predisposition to cancer given the number of cases in the family and young ages of onset. We, therefore, discussed and recommended the following at today's visit.   DISCUSSION: We discussed that, in general, most cancer is not inherited in families, but instead is sporadic or familial. Sporadic cancers occur by chance and typically happen at older ages (>50 years) as this type of cancer is caused by genetic changes acquired during an individual's lifetime. Some families have more cancers than would be expected by chance; however, the ages or types of cancer are not consistent with a known genetic mutation or known genetic mutations have been ruled out. This type of familial cancer is thought to be due to a combination of multiple genetic, environmental, hormonal, and lifestyle factors. While this combination of factors likely increases the risk of cancer, the exact source of this risk is not currently identifiable or testable.  We discussed that 5 - 10% of breast cancer is hereditary, with most cases associated with BRCA mutations.  There are other genes that can be associated with hereditary breast cancer syndromes.  These include ATM, CHEK2 and PALB2.  We discussed that testing is beneficial for several reasons including knowing how to follow individuals after completing their treatment, identifying whether potential treatment options such as PARP inhibitors would be beneficial, and understand if other family members could be at risk for cancer and allow them to undergo genetic testing.   We reviewed the characteristics, features and inheritance patterns of hereditary cancer syndromes. We also discussed genetic testing, including the appropriate family members to test, the process of testing, insurance coverage and turn-around-time for results. We discussed the implications of a negative, positive,  carrier and/or variant of uncertain significant result. Ms. Livingood  was offered a common hereditary cancer panel (40+ genes) and an expanded pan-cancer panel (70+ genes). Ms. Cramm was informed of the benefits and limitations of each panel, including that expanded pan-cancer panels contain genes that do not have clear management guidelines at this point in time.  We also discussed that as the number of genes included on a panel increases, the chances of variants of uncertain significance increases. Ms. Dottery decided to pursue genetic testing for the CancerNext-Expanded+RNAinsight gene panel.   The CancerNext-Expanded gene panel offered by [facility] and includes sequencing and rearrangement analysis for the following 77 genes: AIP, ALK, APC*, ATM*, AXIN2, BAP1, BARD1, BMPR1A, BRCA1*, BRCA2*, BRIP1*, CDC73, CDH1*, CDK4, CDKN1B, CDKN2A, CHEK2*, CTNNA1, DICER1, FH, FLCN, KIF1B, LZTR1, MAX, MEN1, MET, MLH1*, MSH2*, MSH3, MSH6*, MUTYH*, NF1*, NF2, NTHL1, PALB2*, PHOX2B, PMS2*, POT1, PRKAR1A,  PTCH1, PTEN*, RAD51C*, RAD51D*, RB1, RET, SDHA, SDHAF2, SDHB, SDHC, SDHD, SMAD4, SMARCA4, SMARCB1, SMARCE1, STK11, SUFU, TMEM127, TP53*, TSC1, TSC2, and VHL (sequencing and deletion/duplication); EGFR, EGLN1, HOXB13, KIT, MITF, PDGFRA, POLD1, and POLE (sequencing only); EPCAM and GREM1 (deletion/duplication only). DNA and RNA analyses performed for * genes.   Based on Ms. Chao's personal and family history of cancer, she meets medical criteria for genetic testing. She does not carry health insurance, so she applied for the patient assistance program through [employer]   We discussed that some people do not want to undergo genetic testing due to fear of genetic discrimination.  The Genetic Information Nondiscrimination Act (GINA) was signed into federal law in 2008. GINA prohibits health insurers and most employers from discriminating against individuals based on genetic information (including the results of genetic  tests and family history information). According to GINA, health insurance companies cannot consider genetic information to be a preexisting condition, nor can they use it to make decisions regarding coverage or rates. GINA also makes it illegal for most employers to use genetic information in making decisions about hiring, firing, promotion, or terms of employment. It is important to note that GINA does not offer protections for life insurance, disability insurance, or long-term care insurance. GINA does not apply to those in the [employer], those who work for companies with less than 15 employees, and new life insurance or long-term disability insurance policies.  Health status due to a cancer diagnosis is not protected under GINA. More information about GINA can be found by visiting [email or web address].   PLAN: After considering the risks, benefits, and limitations, Ms. Sewer provided informed consent to pursue genetic testing and the blood sample was sent to [facility] for analysis of the CancerNext-Expanded+RNAinsight. Results should be available within approximately 2-3 weeks' time, at which point they will be disclosed by telephone to Ms. Mehring, as will any additional recommendations warranted by these results. Ms. Naidoo will receive a summary of her genetic counseling visit and a copy of her results once available. This information will also be available in Epic.   Lastly, we encouraged Ms. Cadd to remain in contact with cancer genetics annually so that we can continuously update the family history and inform her of any changes in cancer genetics and testing that may be of benefit for this family.   Ms. Hrabovsky questions were answered to her satisfaction today. Our contact information was provided should additional questions or concerns arise. Thank you for the referral and allowing [place] to share in the care of your patient.   Davione Lenker P. Lowell Guitar, MS, [facility] Licensed, Research officer, political party [email or web address] phone: [phone]  The patient was seen for a total of 40 minutes in face-to-face genetic counseling.  The patient brought a friend. Drs. Meliton Rattan, and/or Rodriguez Camp were available for questions, if needed..    _______________________________________________________________________ For Office Staff:  Number of people involved in session: 2 Was an Intern/ student involved with case: no

## 2022-11-26 LAB — URINE CULTURE
MICRO NUMBER:: 15452124
SPECIMEN QUALITY:: ADEQUATE

## 2022-11-28 ENCOUNTER — Other Ambulatory Visit: Payer: Self-pay | Admitting: Family

## 2022-11-28 ENCOUNTER — Ambulatory Visit: Payer: Self-pay | Attending: Cardiology | Admitting: Cardiology

## 2022-11-28 ENCOUNTER — Encounter: Payer: Self-pay | Admitting: Cardiology

## 2022-11-28 VITALS — BP 120/80 | HR 60 | Ht 62.0 in | Wt 144.2 lb

## 2022-11-28 DIAGNOSIS — N3000 Acute cystitis without hematuria: Secondary | ICD-10-CM

## 2022-11-28 DIAGNOSIS — R200 Anesthesia of skin: Secondary | ICD-10-CM

## 2022-11-28 MED ORDER — AMOXICILLIN-POT CLAVULANATE 875-125 MG PO TABS
1.0000 | ORAL_TABLET | Freq: Two times a day (BID) | ORAL | 0 refills | Status: AC
Start: 2022-11-28 — End: 2022-12-05

## 2022-11-28 NOTE — Progress Notes (Signed)
Cardiology Office Note:    Date:  11/28/2022   ID:  Traci Knight, DOB 05-Jan-1961, MRN [id number]  PCP:  Allegra Grana, FNP   [facility] Providers Cardiologist:  None     Referring MD: Allegra Grana, FNP   Chief Complaint  Patient presents with   New Patient (Initial Visit)    Referred for cardiac evaluation of Palpitations.  Seen by [facility] cardiology in Feb 24, 2015.  5 months ago patient had an episode where extremities were numb and did not "feel good at all".  Denies palpitations with Intermittent chest pressure.      History of Present Illness:    Traci Knight is a 62 y.o. female with history of DCIS presenting to review cardiac testing.  She has a history of arm or leg numbness, 1 episode of chest pain about 20 years ago.  Due to arm numbness, cardiac monitor and echocardiogram was obtained by primary care provider.  Denies any symptoms currently.  Feels well.  Past Medical History:  Diagnosis Date   DCIS (ductal carcinoma in situ)    Family history of breast cancer    Pneumonia     Past Surgical History:  Procedure Laterality Date   BREAST BIOPSY Left    62 years of age; abnormal cells, prescribed tamoxifen she didnt take.   TUBAL LIGATION  1997    Current Medications: Current Meds  Medication Sig   amoxicillin-clavulanate (AUGMENTIN) 875-125 MG tablet Take 1 tablet by mouth 2 (two) times daily for 7 days.     Allergies:   Sulfa antibiotics and Hydrocodone   Social History   Socioeconomic History   Marital status: Unknown    Spouse name: Not on file   Number of children: Not on file   Years of education: Not on file   Highest education level: Not on file  Occupational History   Not on file  Tobacco Use   Smoking status: Never   Smokeless tobacco: Never  Vaping Use   Vaping status: Never Used  Substance and Sexual Activity   Alcohol use: Yes    Comment: occ   Drug use: Never   Sexual activity: Not on file  Other Topics Concern   Not  on file  Social History Narrative   Husband passed away 02-23-22   4 children      Sir Speedy    Social Determinants of Corporate investment banker Strain: Not on file  Food Insecurity: Not on file  Transportation Needs: Not on file  Physical Activity: Not on file  Stress: Not on file  Social Connections: Not on file     Family History: The patient's family history includes Breast cancer in her maternal aunt; Breast cancer (age of onset: 34) in her mother; Cancer in her maternal grandmother; Diabetes in her paternal grandmother; Kidney failure in her paternal grandfather; Throat cancer in her maternal grandfather. There is no history of Colon cancer.  ROS:   Please see the history of present illness.     All other systems reviewed and are negative.  EKGs/Labs/Other Studies Reviewed:    The following studies were reviewed today:  EKG Interpretation Date/Time:  Monday November 28 2022 09:53:26 EDT Ventricular Rate:  60 PR Interval:  140 QRS Duration:  88 QT Interval:  374 QTC Calculation: 374 R Axis:   3  Text Interpretation: Normal sinus rhythm Normal ECG Confirmed by Debbe Odea ([id number]) on 11/28/2022 9:57:45 AM    Recent Labs: 09/26/2022: ALT 13;  BUN 19; Creatinine, Ser 0.93; Potassium 4.6; Sodium 140 10/26/2022: Hemoglobin 12.6; Platelets 265.0  Recent Lipid Panel    Component Value Date/Time   CHOL 242 (H) 09/26/2022 0930   TRIG 101.0 09/26/2022 0930   HDL 55.10 09/26/2022 0930   CHOLHDL 4 09/26/2022 0930   VLDL 20.2 09/26/2022 0930   LDLCALC 167 (H) 09/26/2022 0930     Risk Assessment/Calculations:             Physical Exam:    VS:  BP 120/80 (BP Location: Right Arm, Patient Position: Sitting, Cuff Size: Normal)   Pulse 60   Ht 5\' 2"  (1.575 m)   Wt 144 lb 3.2 oz (65.4 kg)   LMP  (LMP Unknown)   SpO2 99%   BMI 26.37 kg/m     Wt Readings from Last 3 Encounters:  11/28/22 144 lb 3.2 oz (65.4 kg)  11/23/22 143 lb (64.9 kg)  10/26/22 143 lb  (64.9 kg)     GEN:  Well nourished, well developed in no acute distress HEENT: Normal NECK: No JVD; No carotid bruits CARDIAC: RRR, no murmurs, rubs, gallops RESPIRATORY:  Clear to auscultation without rales, wheezing or rhonchi  ABDOMEN: Soft, non-tender, non-distended MUSCULOSKELETAL:  No edema; No deformity  SKIN: Warm and dry NEUROLOGIC:  Alert and oriented x 3 PSYCHIATRIC:  Normal affect   ASSESSMENT:    1. Arm numbness    PLAN:    In order of problems listed above:  History of arm numbness, remote chest pain 20 years ago.  Currently asymptomatic.  Cardiac monitor showed no significant arrhythmias, EKG today was normal.  Echocardiogram showed low normal systolic function, normal diastolic function, no significant structural or valvular abnormalities.  Patient made aware of results, reassured.  Heart healthy diet, active lifestyle encouraged.  Denies palpitations.  Follow-up as needed.      Medication Adjustments/Labs and Tests Ordered: Current medicines are reviewed at length with the patient today.  Concerns regarding medicines are outlined above.  Orders Placed This Encounter  Procedures   EKG 12-Lead   No orders of the defined types were placed in this encounter.   Patient Instructions  Medication Instructions:   Your physician recommends that you continue on your current medications as directed. Please refer to the Current Medication list given to you today.  *If you need a refill on your cardiac medications before your next appointment, please call your pharmacy*   Lab Work:  None Ordered  If you have labs (blood work) drawn today and your tests are completely normal, you will receive your results only by: MyChart Message (if you have MyChart) OR A paper copy in the mail If you have any lab test that is abnormal or we need to change your treatment, we will call you to review the results.   Testing/Procedures:  None Ordered  Follow-Up: At [facility], you and your health needs are our priority.  As part of our continuing mission to provide you with exceptional heart care, we have created designated Provider Care Teams.  These Care Teams include your primary Cardiologist (physician) and Advanced Practice Providers (APPs -  Physician Assistants and Nurse Practitioners) who all work together to provide you with the care you need, when you need it.  We recommend signing up for the patient portal called "MyChart".  Sign up information is provided on this After Visit Summary.  MyChart is used to connect with patients for Virtual Visits (Telemedicine).  Patients are able to view  lab/test results, encounter notes, upcoming appointments, etc.  Non-urgent messages can be sent to your provider as well.   To learn more about what you can do with MyChart, go to [email or web address].    Your next appointment:    As needed    Signed, Debbe Odea, MD  11/28/2022 10:41 AM    [facility]

## 2022-11-28 NOTE — Patient Instructions (Signed)
Medication Instructions:   Your physician recommends that you continue on your current medications as directed. Please refer to the Current Medication list given to you today.  *If you need a refill on your cardiac medications before your next appointment, please call your pharmacy*   Lab Work:  None Ordered  If you have labs (blood work) drawn today and your tests are completely normal, you will receive your results only by: MyChart Message (if you have MyChart) OR A paper copy in the mail If you have any lab test that is abnormal or we need to change your treatment, we will call you to review the results.   Testing/Procedures:  None Ordered   Follow-Up: At [facility], you and your health needs are our priority.  As part of our continuing mission to provide you with exceptional heart care, we have created designated Provider Care Teams.  These Care Teams include your primary Cardiologist (physician) and Advanced Practice Providers (APPs -  Physician Assistants and Nurse Practitioners) who all work together to provide you with the care you need, when you need it.  We recommend signing up for the patient portal called "MyChart".  Sign up information is provided on this After Visit Summary.  MyChart is used to connect with patients for Virtual Visits (Telemedicine).  Patients are able to view lab/test results, encounter notes, upcoming appointments, etc.  Non-urgent messages can be sent to your provider as well.   To learn more about what you can do with MyChart, go to [email or web address].    Your next appointment:    As needed 

## 2022-12-09 ENCOUNTER — Encounter: Payer: Self-pay | Admitting: Genetic Counselor

## 2022-12-09 DIAGNOSIS — Z1379 Encounter for other screening for genetic and chromosomal anomalies: Secondary | ICD-10-CM | POA: Insufficient documentation

## 2022-12-12 ENCOUNTER — Telehealth: Payer: Self-pay | Admitting: Genetic Counselor

## 2022-12-12 NOTE — Telephone Encounter (Signed)
LM on VM that results are back and to please call.  Left CB instructions. 

## 2022-12-15 NOTE — Telephone Encounter (Signed)
Left 2nd message on VM asking for her to return call.

## 2022-12-16 ENCOUNTER — Encounter: Payer: Self-pay | Admitting: Genetic Counselor

## 2022-12-16 NOTE — Telephone Encounter (Signed)
Left 3rd message on VM that results were back.  I will send an EPIC message as well.

## 2022-12-19 ENCOUNTER — Ambulatory Visit: Payer: Self-pay | Admitting: Genetic Counselor

## 2022-12-19 DIAGNOSIS — Z1379 Encounter for other screening for genetic and chromosomal anomalies: Secondary | ICD-10-CM

## 2022-12-19 NOTE — Telephone Encounter (Signed)
Revealed negative genetic testing.  Discussed that we do not know why she has a history of breast cancer or why there is cancer in the family. It could be due to a different gene that we are not testing, or maybe our current technology may not be able to pick something up.  It will be important for her to keep in contact with genetics to keep up with whether additional testing may be needed.

## 2022-12-19 NOTE — Progress Notes (Signed)
HPI:  Traci Knight was previously seen in the [facility] due to a personal and family history of breast cancer and concerns regarding a hereditary predisposition to cancer. Please refer to our prior cancer genetics clinic note for more information regarding our discussion, assessment and recommendations, at the time. Traci Knight recent genetic test results were disclosed to her, as were recommendations warranted by these results. These results and recommendations are discussed in more detail below.  CANCER HISTORY:  Oncology History  DCIS (ductal carcinoma in situ)   Initial Diagnosis   DCIS (ductal carcinoma in situ)   12/09/2022 Genetic Testing   Negative genetic testing on the CancerNext-Expanded+RNAinsight.  The report date is 12/09/2022.  The CustomNext-Cancer gene panel offered by [facility] and includes sequencing and rearrangement analysis for the following 91 genes: AIP, ALK, APC*, ATM*, AXIN2, BAP1, BARD1, BLM, BMPR1A, BRCA1*, BRCA2*, BRIP1*, CDC73, CDH1*, CDK4, CDKN1B, CDKN2A, CHEK2*, CTNNA1, DICER1, FANCC, FH, FLCN, GALNT12, KIF1B, LZTR1, MAX, MEN1, MET, MLH1*, MRE11A, MSH2*, MSH3, MSH6*, MUTYH*, NBN, NF1*, NF2, NTHL1, PALB2*, PHOX2B, PMS2*, POT1, PRKAR1A, PTCH1, PTEN*, RAD50, RAD51C*, RAD51D*, RB1, RECQL, RET, SDHA, SDHAF2, SDHB, SDHC, SDHD, SMAD4, SMARCA4, SMARCB1, SMARCE1, STK11, SUFU, TMEM127, TP53*, TSC1, TSC2, VHL and XRCC2 (sequencing and deletion/duplication); CASR, CFTR, CPA1, CTRC, EGFR, EGLN1, FAM175A, HOXB13, KIT, MITF, MLH3, PALLD, PDGFRA, POLD1, POLE, PRSS1, RINT1, RPS20, SPINK1 and TERT (sequencing only); EPCAM and GREM1 (deletion/duplication only). DNA and RNA analyses performed for * genes.      FAMILY HISTORY:  We obtained a detailed, 4-generation family history.  Significant diagnoses are listed below: Family History  Problem Relation Age of Onset   Breast cancer Mother 1   Breast cancer Maternal Aunt    Cancer Maternal Grandmother         Vulva dx. 60s   Throat cancer Maternal Grandfather    Diabetes Paternal Grandmother    Kidney failure Paternal Grandfather    Colon cancer Neg Hx        The patient has two daughters and two sons who are cancer free.  She has two sisters and two brothers who are cancer free.  Her father is living and her mother is deceased.   The patient's mother was diagnosed with breast cancer at 28 and died at 54.  She had one sister and three brothers.  Her sister had breast cancer in her 43's and had a double mastectomy.  The maternal grandfather had throat cancer and the grandmother had vulvar cancer in her 75's.  The grandmother had about 12 siblings, some of whom had cancer.   The patient's father is living.  He had two sisters and two brothers, none reported to have cancer.  There is no other reported family history of cancer.   Traci Knight is unaware of previous family history of genetic testing for hereditary cancer risks. Patient's maternal ancestors are of [place] and [place] descent, and paternal ancestors are of [place] and [place] descent. There is no reported Ashkenazi Jewish ancestry. There is no known consanguinity  GENETIC TEST RESULTS: Genetic testing reported out on December 08, 2022 through the CancerNext-Expanded+RNAinsight cancer panel found no pathogenic mutations. The CancerNext-Expanded gene panel offered by [facility] and includes sequencing and rearrangement analysis for the following 77 genes: AIP, ALK, APC*, ATM*, AXIN2, BAP1, BARD1, BMPR1A, BRCA1*, BRCA2*, BRIP1*, CDC73, CDH1*, CDK4, CDKN1B, CDKN2A, CHEK2*, CTNNA1, DICER1, FH, FLCN, KIF1B, LZTR1, MAX, MEN1, MET, MLH1*, MSH2*, MSH3, MSH6*, MUTYH*, NF1*, NF2, NTHL1, PALB2*, PHOX2B, PMS2*, POT1, PRKAR1A, PTCH1, PTEN*,  RAD51C*, RAD51D*, RB1, RET, SDHA, SDHAF2, SDHB, SDHC, SDHD, SMAD4, SMARCA4, SMARCB1, SMARCE1, STK11, SUFU, TMEM127, TP53*, TSC1, TSC2, and VHL (sequencing and deletion/duplication); EGFR, EGLN1, HOXB13, KIT, MITF, PDGFRA,  POLD1, and POLE (sequencing only); EPCAM and GREM1 (deletion/duplication only). DNA and RNA analyses performed for * genes.  The test report has been scanned into EPIC and is located under the Molecular Pathology section of the Results Review tab.  A portion of the result report is included below for reference.     We discussed with Traci Knight that because current genetic testing is not perfect, it is possible there may be a gene mutation in one of these genes that current testing cannot detect, but that chance is small.  We also discussed, that there could be another gene that has not yet been discovered, or that we have not yet tested, that is responsible for the cancer diagnoses in the family. It is also possible there is a hereditary cause for the cancer in the family that Traci Knight did not inherit and therefore was not identified in her testing.  Therefore, it is important to remain in touch with cancer genetics in the future so that we can continue to offer Traci Knight the most up to date genetic testing.   ADDITIONAL GENETIC TESTING: We discussed with Traci Knight that her genetic testing was fairly extensive.  If there are genes identified to increase cancer risk that can be analyzed in the future, we would be happy to discuss and coordinate this testing at that time.    CANCER SCREENING RECOMMENDATIONS: Traci Knight test result is considered negative (normal).  This means that we have not identified a hereditary cause for her personal and family history of breast cancer at this time. Most cancers happen by chance and this negative test suggests that her personal and family history of cancer may fall into this category.    Possible reasons for Traci Knight's negative genetic test include:  1. There may be a gene mutation in one of these genes that current testing methods cannot detect but that chance is small.  2. There could be another gene that has not yet been discovered, or that we have not yet tested,  that is responsible for the cancer diagnoses in the family.  3.  There may be no hereditary risk for cancer in the family. The cancers in Traci Knight and/or her family may be sporadic/familial or due to other genetic and environmental factors. 4. It is also possible there is a hereditary cause for the cancer in the family that Traci Knight did not inherit.  Therefore, it is recommended she continue to follow the cancer management and screening guidelines provided by her oncology and primary healthcare provider. An individual's cancer risk and medical management are not determined by genetic test results alone. Overall cancer risk assessment incorporates additional factors, including personal medical history, family history, and any available genetic information that may result in a personalized plan for cancer prevention and surveillance  RECOMMENDATIONS FOR FAMILY MEMBERS:  Individuals in this family might be at some increased risk of developing cancer, over the general population risk, simply due to the family history of cancer.  We recommended women in this family have a yearly mammogram beginning at age 56, or 66 years younger than the earliest onset of cancer, an annual clinical breast exam, and perform monthly breast self-exams. Women in this family should also have a gynecological exam as recommended by their primary provider. All family members  should be referred for colonoscopy starting at age 81.  FOLLOW-UP: Lastly, we discussed with Traci Knight that cancer genetics is a rapidly advancing field and it is possible that new genetic tests will be appropriate for her and/or her family members in the future. We encouraged her to remain in contact with cancer genetics on an annual basis so we can update her personal and family histories and let her know of advances in cancer genetics that may benefit this family.   Our contact number was provided. Traci Knight questions were answered to her satisfaction, and  she knows she is welcome to call us at anytime with additional questions or concerns.   Maylon Cos, MS, [facility] Licensed, Certified Genetic Counselor [email or web address]

## 2023-01-12 ENCOUNTER — Other Ambulatory Visit: Payer: Self-pay

## 2023-01-12 DIAGNOSIS — Z1231 Encounter for screening mammogram for malignant neoplasm of breast: Secondary | ICD-10-CM

## 2023-01-12 DIAGNOSIS — Z803 Family history of malignant neoplasm of breast: Secondary | ICD-10-CM

## 2023-01-30 ENCOUNTER — Ambulatory Visit: Payer: Self-pay

## 2023-03-17 ENCOUNTER — Other Ambulatory Visit: Payer: Self-pay

## 2023-03-17 DIAGNOSIS — Z1231 Encounter for screening mammogram for malignant neoplasm of breast: Secondary | ICD-10-CM

## 2023-04-10 ENCOUNTER — Other Ambulatory Visit: Payer: Self-pay

## 2023-04-10 ENCOUNTER — Ambulatory Visit: Admission: RE | Admit: 2023-04-10 | Payer: Self-pay | Source: Ambulatory Visit

## 2023-04-10 ENCOUNTER — Ambulatory Visit: Payer: Self-pay | Attending: Hematology and Oncology | Admitting: *Deleted

## 2023-04-10 VITALS — BP 101/75 | Wt 137.0 lb

## 2023-04-10 DIAGNOSIS — Z1211 Encounter for screening for malignant neoplasm of colon: Secondary | ICD-10-CM

## 2023-04-10 DIAGNOSIS — N6322 Unspecified lump in the left breast, upper inner quadrant: Secondary | ICD-10-CM

## 2023-04-10 DIAGNOSIS — Z01419 Encounter for gynecological examination (general) (routine) without abnormal findings: Secondary | ICD-10-CM

## 2023-04-10 NOTE — Patient Instructions (Signed)
Explained breast self awareness with  Basil Dess. Pap smear completed today. Let her know B[facility]CP will cover Pap smears and HPV typing every 5 years unless has a history of abnormal Pap smears. Referred patient to the [facility] for a diagnostic mammogram. Appointment scheduled Monday, April 17, 2023 at 1340. Patient aware of appointment and will be there. Let patient know will follow up with her within the next couple of weeks with results of her Pap smear by letter or phone. Tamasha Laplante verbalized understanding.  Meenakshi Sazama, Kathaleen Maser, RN 3:14 PM

## 2023-04-10 NOTE — Progress Notes (Signed)
[name] is a 63 y.o. No obstetric history on file. female who presents to [facility] clinic today with no complaints.    Pap Smear: Pap smear completed today. Last Pap smear was 01/03/2017 at [facility] and was normal with negative HPV. Per patient has history of two abnormal Pap smears with one around 30 years ago and another around 40 years ago. Patient stated she had repeat Pap smears for both abnormal Pap smears that came back normal. Patient stated she has had more than three normal Pap smears since her last abnormal Pap smear. Last Pap smear result is available in Epic.   Physical exam: Breasts Breasts symmetrical. No skin abnormalities bilateral breasts. No nipple retraction bilateral breasts. No nipple discharge bilateral breasts. No lymphadenopathy. No lumps palpated right breast. Palpated a lump within the left breast at 9 o'clock 5 cm from the nipple. No complaints of pain or tenderness on exam.       Pelvic/Bimanual Ext Genitalia No lesions, no swelling and no discharge observed on external genitalia.        Vagina Vagina pink and normal texture. No lesions or discharge observed in vagina.        Cervix Cervix is present. Cervix pink and of normal texture. Cervix friable. No discharge observed.    Uterus Uterus is present and palpable. Uterus in normal position and normal size.        Adnexae Bilateral ovaries present and palpable. No tenderness on palpation.         Rectovaginal No rectal exam completed today since patient had no rectal complaints. Hemrroid observed on exam. No skin abnormalities observed on exam.     Smoking History: Patient has never smoked.   Patient Navigation: Patient education provided. Access to services provided for patient through [facility] program.   Colorectal Cancer Screening: Per patient has had colonoscopy completed on 03/14/2013. FIT Test given to patient to complete.  No complaints today.    Breast and  Cervical Cancer Risk Assessment: Patient has family history of mother and maternal aunt having breast cancer. Patient has personal history of breast cancer. Patient has no known genetic mutations or radiation treatment to the chest before age 85. Patient does not have history of cervical dysplasia, immunocompromised, or DES exposure in-utero. Breast cancer risk assessment completed. No breast cancer risk calculated due to patient has history of DCIS.  Risk Assessment   No risk assessment data    A: [facility]CP exam with pap smear No complaints.  P: Referred patient to the [facility] for a diagnostic mammogram. Appointment scheduled Monday, April 17, 2023 at 1340.  Priscille Heidelberg, RN 04/10/2023 3:14 PM

## 2023-04-13 LAB — CYTOLOGY - PAP
Comment: NEGATIVE
Comment: NEGATIVE
Comment: NEGATIVE
Diagnosis: UNDETERMINED — AB
HPV 16: POSITIVE — AB
HPV 18 / 45: NEGATIVE
High risk HPV: POSITIVE — AB

## 2023-04-17 ENCOUNTER — Ambulatory Visit
Admission: RE | Admit: 2023-04-17 | Discharge: 2023-04-17 | Disposition: A | Discharge status: Home / Self Care | Payer: Self-pay | Source: Ambulatory Visit | Attending: Obstetrics and Gynecology | Admitting: Obstetrics and Gynecology

## 2023-04-17 DIAGNOSIS — N6322 Unspecified lump in the left breast, upper inner quadrant: Secondary | ICD-10-CM

## 2023-04-19 ENCOUNTER — Telehealth: Payer: Self-pay

## 2023-04-19 NOTE — Telephone Encounter (Signed)
Attempted to contact patient regarding lab(pap) results. Left message on voicemail requesting a return call.  

## 2023-04-20 ENCOUNTER — Telehealth: Payer: Self-pay

## 2023-04-20 NOTE — Telephone Encounter (Addendum)
 Patient informed Pap results, ASC-US  with positive HPV 16, recommendation is to have a colposcopy. Patient stated her current partner of 6 months told her that he was HPV positive, had a spot removed from penis around 8 years ago, no outbreak since then, not sure if she could have received infection from current partner, as he has not had any outbreaks.Discussed results and HPV with patient. Patient verbalized understanding.

## 2023-05-04 ENCOUNTER — Other Ambulatory Visit (HOSPITAL_COMMUNITY)
Admission: RE | Admit: 2023-05-04 | Discharge: 2023-05-04 | Disposition: A | Discharge status: Home / Self Care | Payer: Self-pay | Source: Ambulatory Visit | Attending: Obstetrics and Gynecology | Admitting: Obstetrics and Gynecology

## 2023-05-04 ENCOUNTER — Encounter: Payer: Self-pay | Admitting: Obstetrics and Gynecology

## 2023-05-04 ENCOUNTER — Ambulatory Visit: Payer: Self-pay | Admitting: Obstetrics and Gynecology

## 2023-05-04 VITALS — BP 104/66 | HR 79 | Ht 62.0 in | Wt 143.1 lb

## 2023-05-04 DIAGNOSIS — R87810 Cervical high risk human papillomavirus (HPV) DNA test positive: Secondary | ICD-10-CM | POA: Insufficient documentation

## 2023-05-04 DIAGNOSIS — N870 Mild cervical dysplasia: Secondary | ICD-10-CM

## 2023-05-04 DIAGNOSIS — R8789 Other abnormal findings in specimens from female genital organs: Secondary | ICD-10-CM | POA: Insufficient documentation

## 2023-05-04 DIAGNOSIS — Z7689 Persons encountering health services in other specified circumstances: Secondary | ICD-10-CM

## 2023-05-04 DIAGNOSIS — R87610 Atypical squamous cells of undetermined significance on cytologic smear of cervix (ASC-US): Secondary | ICD-10-CM

## 2023-05-04 NOTE — Progress Notes (Signed)
Patient presents today for a colposcopy. She recently had an abnormal pap smear resulting in ASCUS/HPV16+ . No further concerns today.

## 2023-05-04 NOTE — Progress Notes (Signed)
HPI:  Traci Knight is a 63 y.o.  G4P4  who presents today for evaluation and management of abnormal cervical cytology.    Dysplasia History: Patient describes a remote history of abnormal Pap smears in her "30s".  Her most recent Pap smears have been normal with negative HPV. The Pap we have on file most recently shows ASCUS. She does report a new sexual partner within the last 6 months.  HPV: Positive type 16  ROS:  Pertinent items noted in HPI and remainder of comprehensive ROS otherwise negative.  OB History  Gravida Para Term Preterm AB Living  4 4      SAB IAB Ectopic Multiple Live Births          # Outcome Date GA Lbr Len/2nd Weight Sex Type Anes PTL Lv  4 Para           3 Para           2 Para           1 Para             Past Medical History:  Diagnosis Date   DCIS (ductal carcinoma in situ)    Family history of breast cancer    Pneumonia     Past Surgical History:  Procedure Laterality Date   BREAST BIOPSY Left    63 years of age; abnormal cells, prescribed tamoxifen she didnt take.   TUBAL LIGATION  1997    SOCIAL HISTORY:  Social History   Substance and Sexual Activity  Alcohol Use Yes   Comment: occ    Social History   Substance and Sexual Activity  Drug Use Never     Family History  Problem Relation Age of Onset   Breast cancer Mother 43       Passed at 61yo   Breast cancer Maternal Aunt 30 - 39       Pt is still alive. Bilateral mastectomy.   Cancer Maternal Grandmother        Vulva dx. 60s   Throat cancer Maternal Grandfather    Diabetes Paternal Grandmother    Kidney failure Paternal Grandfather    Colon cancer Neg Hx     ALLERGIES:  Sulfa antibiotics and Hydrocodone  She currently has no medications in their medication list.  Physical Exam: -Vitals:  BP 104/66   Pulse 79   Ht 5\' 2"  (1.575 m)   Wt 143 lb 1.6 oz (64.9 kg)   LMP  (LMP Unknown)   BMI 26.17 kg/m   PROCEDURE: Colposcopy performed with 4% acetic acid  after verbal consent obtained                           -Aceto-white Lesions Location(s): See above              -Biopsy performed at 5 and 7 o'clock               -ECC indicated and performed: No.     -Biopsy sites made hemostatic with pressure and Monsel's solution   -Satisfactory colposcopy: Yes.      -Evidence of Invasive cervical CA :  NO  ASSESSMENT:  Traci Knight is a 63 y.o. G4P4 here for  1. Establishing care with new doctor, encounter for   2. ASCUS with positive high risk HPV cervical   3. 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  PLAN: 1.  I discussed the grading system of pap smears and HPV high risk viral types.  We will discuss management after colpo results return.  No orders of the defined types were placed in this encounter.       F/U  Return in about 2 weeks (around 05/18/2023) for Colpo f/u.  Brennan Bailey ,MD 05/04/2023,1:12 PM

## 2023-05-05 LAB — SURGICAL PATHOLOGY

## 2023-05-18 ENCOUNTER — Encounter: Payer: Self-pay | Admitting: Obstetrics and Gynecology

## 2023-05-18 ENCOUNTER — Telehealth (INDEPENDENT_AMBULATORY_CARE_PROVIDER_SITE_OTHER): Payer: Self-pay | Admitting: Obstetrics and Gynecology

## 2023-05-18 DIAGNOSIS — N870 Mild cervical dysplasia: Secondary | ICD-10-CM

## 2023-05-18 NOTE — Progress Notes (Signed)
 Virtual Visit via Video Note  I connected with Traci Knight on 05/18/23 at  7:55 AM EST by video and verified that I was speaking with the correct person using two identifiers.    [name] is a 63 y.o. G4P4 who LMP was No LMP recorded (lmp unknown). Patient is postmenopausal. I discussed the limitations, risks, security and privacy concerns of performing an evaluation and management service by video and the availability of in person appointments. I also discussed with the patient that there may be a patient responsible charge related to this service. The patient expressed understanding and agreed to proceed.  Location of patient:  Car  Patient gave explicit verbal consent for video visit:  YES  Location of provider:  [facility] office  Persons other than physician and patient involved in provider conference:  None   Subjective:   History of Present Illness:    Patient has a remote history of abnormal Pap smears more than 30 years ago.  She recently has a new sexual partner and was found to have an ASCUS Pap with positive type 16.  She presented for and underwent colposcopy.  This is a follow-up of her colposcopy visit  Hx: The following portions of the patient's history were reviewed and updated as appropriate:             She  has a past medical history of DCIS (ductal carcinoma in situ), Family history of breast cancer, and Pneumonia. She does not have any pertinent problems on file. She  has a past surgical history that includes Breast biopsy (Left) and Tubal ligation (1997). Her family history includes Breast cancer (age of onset: 57 - 17) in her maternal aunt; Breast cancer (age of onset: 78) in her mother; Cancer in her maternal grandmother; Diabetes in her paternal grandmother; Kidney failure in her paternal grandfather; Throat cancer in her maternal grandfather. She  reports that she has never smoked. She has never used smokeless tobacco. She reports current alcohol use. She  reports that she does not use drugs. She currently has no medications in their medication list. She is allergic to sulfa antibiotics and hydrocodone.       Review of Systems:  Review of Systems  Constitutional: Denied constitutional symptoms, night sweats, recent illness, fatigue, fever, insomnia and weight loss.  Eyes: Denied eye symptoms, eye pain, photophobia, vision change and visual disturbance.  Ears/Nose/Throat/Neck: Denied ear, nose, throat or neck symptoms, hearing loss, nasal discharge, sinus congestion and sore throat.  Cardiovascular: Denied cardiovascular symptoms, arrhythmia, chest pain/pressure, edema, exercise intolerance, orthopnea and palpitations.  Respiratory: Denied pulmonary symptoms, asthma, pleuritic pain, productive sputum, cough, dyspnea and wheezing.  Gastrointestinal: Denied, gastro-esophageal reflux, melena, nausea and vomiting.  Genitourinary: Denied genitourinary symptoms including symptomatic vaginal discharge, pelvic relaxation issues, and urinary complaints.  Musculoskeletal: Denied musculoskeletal symptoms, stiffness, swelling, muscle weakness and myalgia.  Dermatologic: Denied dermatology symptoms, rash and scar.  Neurologic: Denied neurology symptoms, dizziness, headache, neck pain and syncope.  Psychiatric: Denied psychiatric symptoms, anxiety and depression.  Endocrine: Denied endocrine symptoms including hot flashes and night sweats.   Meds:   No current outpatient medications on file prior to visit.   No current facility-administered medications on file prior to visit.    Assessment:    G4P4 Patient Active Problem List   Diagnosis Date Noted   Genetic testing 12/09/2022   Family history of breast cancer    DCIS (ductal carcinoma in situ)    Recurrent UTI 10/28/2022   Encounter  for medical examination to establish care 09/12/2022   Palpitation 09/12/2022   Mixed hyperlipidemia 01/07/2015   History of TIA (transient ischemic attack)  12/25/2014   Tachycardia 07/18/2014   Nonrheumatic mitral valve prolapse 09/11/2013     1. CIN I (cervical intraepithelial neoplasia I)     By colposcopically directed biopsies.  Plan:            1.  I have discussed the natural course and history of CIN and its relationship to HPV.  We have discussed his relationship to cervical cancer as well.  Per ASCCP guidelines I have recommended follow-up Pap smear in 1 year.  I have stressed the importance of this visit. All questions were answered. Orders No orders of the defined types were placed in this encounter.   No orders of the defined types were placed in this encounter.     F/U  Return in about 1 year (around 05/17/2024).   Elonda Husky, M.D. 05/18/2023 8:09 AM

## 2023-05-23 ENCOUNTER — Encounter: Payer: Self-pay | Admitting: Family

## 2023-05-23 ENCOUNTER — Ambulatory Visit (INDEPENDENT_AMBULATORY_CARE_PROVIDER_SITE_OTHER): Payer: Self-pay | Admitting: Family

## 2023-05-23 VITALS — BP 110/70 | HR 92 | Temp 98.2°F | Ht 62.0 in | Wt 145.6 lb

## 2023-05-23 DIAGNOSIS — G479 Sleep disorder, unspecified: Secondary | ICD-10-CM

## 2023-05-23 MED ORDER — TRAZODONE HCL 50 MG PO TABS
25.0000 mg | ORAL_TABLET | Freq: Every evening | ORAL | 3 refills | Status: AC | PRN
Start: 2023-05-23 — End: ?

## 2023-05-23 NOTE — Patient Instructions (Addendum)
 We will trial trazodone.  I sent in a 50 mg tablet however you may take 25 mg for the first week.if ineffective, you may then increase to 50 mg.  After 50 mg if you would like to further increase, we may increase to 100 mg total at bedtime.   You may also adjunct trazodone with taking the melatonin combination medication such as below - ensure that you take around 7pm with dinner as this is when natural melatonin will start to increase  Melatonin combination over the counter supplements to consider one or the orther  Sleep#3 ( L- theanine, camomile, lavender, valerian root, and melatonin 10mg )    OR    Qunol Sleep 5 in 1 (melatonin 5mg , Ashwagandha, GABA, valerian root, L-theanine)   Start vitamin D 800 IUdaily.

## 2023-05-23 NOTE — Progress Notes (Signed)
 Assessment & Plan:  Sleep disturbance Assessment & Plan: Chronic, uncontrolled.  Trial of trazodone 25 mg.  Orders: -     traZODone HCl; Take 0.5-1 tablets (25-50 mg total) by mouth at bedtime as needed for sleep.  Dispense: 90 tablet; Refill: 3     Return precautions given.   Risks, benefits, and alternatives of the medications and treatment plan prescribed today were discussed, and patient expressed understanding.   Education regarding symptom management and diagnosis given to patient on AVS either electronically or printed.  No follow-ups on file.  [name], FNP  Subjective:    Patient ID: Traci Knight, female    DOB: [birth year], 63 y.o.   MRN: [id number]  CC: Traci Knight is a 63 y.o. female who presents today for follow up.   HPI: Complains of trouble staying asleep  She is sleeping 5 hours per night  She has tried melatonin without relief.   She has a hard time turning her mind off.   She lost her husband 2 years ago.        Recently diagnosed with ASC-US, HPV positive.  S/p colposcopy CIN1 Follow up Dr Logan Bores 05/18/23 ; repeat Pap smear in one year  Mammogram is up to date 04/17/23  Allergies: Sulfa antibiotics and Hydrocodone No current outpatient medications on file prior to visit.   No current facility-administered medications on file prior to visit.    Review of Systems  Constitutional:  Negative for chills and fever.  Respiratory:  Negative for cough.   Cardiovascular:  Negative for chest pain and palpitations.  Gastrointestinal:  Negative for nausea and vomiting.  Psychiatric/Behavioral:  Positive for sleep disturbance.       Objective:    BP 110/70   Pulse 92   Temp 98.2 F (36.8 C) (Oral)   Ht 5\' 2"  (1.575 m)   Wt 145 lb 9.6 oz (66 kg)   LMP  (LMP Unknown)   SpO2 98%   BMI 26.63 kg/m  BP Readings from Last 3 Encounters:  05/23/23 110/70  05/04/23 104/66  04/10/23 101/75   Wt Readings from Last 3 Encounters:   05/23/23 145 lb 9.6 oz (66 kg)  05/04/23 143 lb 1.6 oz (64.9 kg)  04/10/23 137 lb (62.1 kg)      05/23/2023    8:14 AM 10/26/2022    2:48 PM 09/12/2022    2:46 PM  Depression screen PHQ 2/9  Decreased Interest 0 0 0  Down, Depressed, Hopeless 0 0 0  PHQ - 2 Score 0 0 0  Altered sleeping  0 0  Tired, decreased energy   3  Change in appetite  0 1  Feeling bad or failure about yourself   0 0  Trouble concentrating  0 0  Moving slowly or fidgety/restless  0 0  Suicidal thoughts  0 0  PHQ-9 Score  0 4    Physical Exam Vitals reviewed.  Constitutional:      Appearance: She is well-developed.  Eyes:     Conjunctiva/sclera: Conjunctivae normal.  Neck:     Thyroid: No thyroid mass, thyromegaly or thyroid tenderness.  Cardiovascular:     Rate and Rhythm: Normal rate and regular rhythm.     Pulses: Normal pulses.     Heart sounds: Normal heart sounds.  Pulmonary:     Effort: Pulmonary effort is normal.     Breath sounds: Normal breath sounds. No wheezing, rhonchi or rales.  Skin:    General: Skin is warm and  dry.  Neurological:     Mental Status: She is alert.  Psychiatric:        Speech: Speech normal.        Behavior: Behavior normal.        Thought Content: Thought content normal.

## 2023-05-23 NOTE — Assessment & Plan Note (Signed)
 Chronic, uncontrolled.  Trial of trazodone 25 mg.
